# Patient Record
Sex: Female | Born: 1965 | Race: White | Hispanic: No | Marital: Single | Smoking: Former smoker
Health system: Southern US, Community
[De-identification: ages and names within clinical notes are randomized; demographics above are authoritative.]

## PROBLEM LIST (undated history)

## (undated) DIAGNOSIS — D241 Benign neoplasm of right breast: Secondary | ICD-10-CM

## (undated) DIAGNOSIS — J189 Pneumonia, unspecified organism: Secondary | ICD-10-CM

## (undated) DIAGNOSIS — R51 Headache: Secondary | ICD-10-CM

## (undated) DIAGNOSIS — R519 Headache, unspecified: Secondary | ICD-10-CM

## (undated) DIAGNOSIS — G8929 Other chronic pain: Secondary | ICD-10-CM

## (undated) DIAGNOSIS — N3941 Urge incontinence: Secondary | ICD-10-CM

## (undated) DIAGNOSIS — R32 Unspecified urinary incontinence: Secondary | ICD-10-CM

## (undated) DIAGNOSIS — Z9889 Other specified postprocedural states: Secondary | ICD-10-CM

## (undated) DIAGNOSIS — F909 Attention-deficit hyperactivity disorder, unspecified type: Secondary | ICD-10-CM

## (undated) DIAGNOSIS — K602 Anal fissure, unspecified: Secondary | ICD-10-CM

## (undated) DIAGNOSIS — S2239XA Fracture of one rib, unspecified side, initial encounter for closed fracture: Secondary | ICD-10-CM

## (undated) DIAGNOSIS — I10 Essential (primary) hypertension: Secondary | ICD-10-CM

## (undated) DIAGNOSIS — J329 Chronic sinusitis, unspecified: Secondary | ICD-10-CM

## (undated) DIAGNOSIS — J45909 Unspecified asthma, uncomplicated: Secondary | ICD-10-CM

## (undated) DIAGNOSIS — R112 Nausea with vomiting, unspecified: Secondary | ICD-10-CM

## (undated) DIAGNOSIS — C4490 Unspecified malignant neoplasm of skin, unspecified: Secondary | ICD-10-CM

## (undated) DIAGNOSIS — R072 Precordial pain: Secondary | ICD-10-CM

## (undated) DIAGNOSIS — S139XXA Sprain of joints and ligaments of unspecified parts of neck, initial encounter: Secondary | ICD-10-CM

## (undated) DIAGNOSIS — J4540 Moderate persistent asthma, uncomplicated: Secondary | ICD-10-CM

## (undated) DIAGNOSIS — A159 Respiratory tuberculosis unspecified: Secondary | ICD-10-CM

## (undated) DIAGNOSIS — J8283 Eosinophilic asthma: Secondary | ICD-10-CM

## (undated) DIAGNOSIS — R569 Unspecified convulsions: Secondary | ICD-10-CM

## (undated) DIAGNOSIS — F419 Anxiety disorder, unspecified: Secondary | ICD-10-CM

## (undated) DIAGNOSIS — I2699 Other pulmonary embolism without acute cor pulmonale: Secondary | ICD-10-CM

## (undated) HISTORY — DX: Unspecified urinary incontinence: R32

## (undated) HISTORY — PX: OTHER SURGICAL HISTORY: SHX169

## (undated) HISTORY — DX: Unspecified asthma, uncomplicated: J45.909

## (undated) HISTORY — DX: Anal fissure, unspecified: K60.2

## (undated) HISTORY — DX: Essential (primary) hypertension: I10

## (undated) HISTORY — DX: Benign neoplasm of right breast: D24.1

## (undated) HISTORY — DX: Unspecified convulsions: R56.9

## (undated) HISTORY — DX: Unspecified malignant neoplasm of skin, unspecified: C44.90

## (undated) HISTORY — DX: Eosinophilic asthma: J82.83

## (undated) HISTORY — DX: Urge incontinence: N39.41

## (undated) HISTORY — DX: Fracture of one rib, unspecified side, initial encounter for closed fracture: S22.39XA

## (undated) HISTORY — DX: Other chronic pain: G89.29

## (undated) HISTORY — DX: Moderate persistent asthma, uncomplicated: J45.40

## (undated) HISTORY — DX: Pneumonia, unspecified organism: J18.9

## (undated) HISTORY — DX: Other pulmonary embolism without acute cor pulmonale: I26.99

## (undated) HISTORY — DX: Precordial pain: R07.2

## (undated) HISTORY — DX: Anxiety disorder, unspecified: F41.9

## (undated) HISTORY — DX: Headache, unspecified: R51.9

## (undated) HISTORY — DX: Attention-deficit hyperactivity disorder, unspecified type: F90.9

## (undated) HISTORY — DX: Headache: R51

## (undated) HISTORY — DX: Sprain of joints and ligaments of unspecified parts of neck, initial encounter: S13.9XXA

## (undated) HISTORY — DX: Respiratory tuberculosis unspecified: A15.9

## (undated) HISTORY — PX: BREAST LUMPECTOMY: SHX2

---

## 1989-01-30 DIAGNOSIS — I2699 Other pulmonary embolism without acute cor pulmonale: Secondary | ICD-10-CM

## 1989-01-30 HISTORY — DX: Other pulmonary embolism without acute cor pulmonale: I26.99

## 2000-01-31 HISTORY — PX: TEMPOROMANDIBULAR JOINT ARTHROPLASTY: SUR76

## 2006-01-30 HISTORY — PX: BASAL CELL CARCINOMA EXCISION: SHX1214

## 2007-01-31 HISTORY — PX: CLAVICLE SURGERY: SHX598

## 2009-12-16 ENCOUNTER — Emergency Department (HOSPITAL_COMMUNITY)
Admission: EM | Admit: 2009-12-16 | Discharge: 2009-12-16 | Payer: Self-pay | Source: Home / Self Care | Admitting: Emergency Medicine

## 2010-04-12 LAB — URINALYSIS, ROUTINE W REFLEX MICROSCOPIC
Glucose, UA: NEGATIVE mg/dL
Specific Gravity, Urine: 1.015 (ref 1.005–1.030)

## 2010-04-12 LAB — POCT I-STAT, CHEM 8
Calcium, Ion: 1.13 mmol/L (ref 1.12–1.32)
Creatinine, Ser: 0.8 mg/dL (ref 0.4–1.2)
Hemoglobin: 15.3 g/dL — ABNORMAL HIGH (ref 12.0–15.0)
Sodium: 138 mEq/L (ref 135–145)
TCO2: 26 mmol/L (ref 0–100)

## 2010-04-12 LAB — URINE MICROSCOPIC-ADD ON

## 2010-04-12 LAB — POCT PREGNANCY, URINE: Preg Test, Ur: NEGATIVE

## 2010-10-07 ENCOUNTER — Other Ambulatory Visit: Payer: Self-pay | Admitting: Obstetrics and Gynecology

## 2010-10-07 DIAGNOSIS — N63 Unspecified lump in unspecified breast: Secondary | ICD-10-CM

## 2010-10-11 ENCOUNTER — Other Ambulatory Visit: Payer: Self-pay

## 2010-10-20 ENCOUNTER — Ambulatory Visit
Admission: RE | Admit: 2010-10-20 | Discharge: 2010-10-20 | Disposition: A | Discharge status: Home / Self Care | Payer: PRIVATE HEALTH INSURANCE | Source: Ambulatory Visit | Attending: Obstetrics and Gynecology | Admitting: Obstetrics and Gynecology

## 2010-10-20 DIAGNOSIS — N63 Unspecified lump in unspecified breast: Secondary | ICD-10-CM

## 2010-10-31 ENCOUNTER — Ambulatory Visit (INDEPENDENT_AMBULATORY_CARE_PROVIDER_SITE_OTHER): Payer: PRIVATE HEALTH INSURANCE | Admitting: General Surgery

## 2010-11-01 ENCOUNTER — Encounter (INDEPENDENT_AMBULATORY_CARE_PROVIDER_SITE_OTHER): Payer: Self-pay | Admitting: General Surgery

## 2010-11-01 ENCOUNTER — Ambulatory Visit (INDEPENDENT_AMBULATORY_CARE_PROVIDER_SITE_OTHER): Payer: PRIVATE HEALTH INSURANCE | Admitting: General Surgery

## 2010-11-01 VITALS — BP 116/82 | HR 60 | Temp 98.0°F | Resp 18 | Ht 63.5 in | Wt 135.4 lb

## 2010-11-01 DIAGNOSIS — N63 Unspecified lump in unspecified breast: Secondary | ICD-10-CM

## 2010-11-01 NOTE — Progress Notes (Signed)
Chief Complaint  Patient presents with  . Other    eval of right breast mass    HPI Tammie Sanders is a 45 y.o. female.   HPI This is a 45 year old female who presents with about a two-year history of a right breast mass. This area has gotten bigger and more irregular over some time. It is not as well defined as before. She underwent a biopsy previously which showed that this was benign. She has undergone a recent ultrasound which correlates with this more irregular larger mass in the results of that are below. She has some increased tenderness in his breast mass also. She denies any other breast complaints including other masses, tenderness, or nipple discharge. She has no prior breast history prior to this. She has no family history of any breast or ovarian cancer either.  Past Medical History  Diagnosis Date  . Seizures     Past Surgical History  Procedure Date  . Clavicle surgery 2009    plate in right clavicle   . Cesarean section 1996, [id number]  . Temporomandibular joint arthroplasty 2002    left     History reviewed. No pertinent family history.  Social History History  Substance Use Topics  . Smoking status: Former Games developer  . Smokeless tobacco: Never Used  . Alcohol Use: 0.0 oz/week    1-2 drink(s) per week    No Known Allergies  Current Outpatient Prescriptions  Medication Sig Dispense Refill  . fish oil-omega-3 fatty acids 1000 MG capsule Take 2 g by mouth daily.        Marland Kitchen FLUoxetine (PROZAC) 20 MG capsule       . Multiple Vitamin (MULTIVITAMIN PO) Take by mouth.        Marland Kitchen VITAMIN E PO Take by mouth.          Review of Systems Review of Systems  Constitutional: Negative.   HENT: Negative.   Eyes: Negative.   Respiratory: Negative.   Cardiovascular: Negative.   Gastrointestinal: Negative.   Genitourinary: Negative.   Musculoskeletal: Negative.   Neurological: Negative.   Hematological: Negative.   Psychiatric/Behavioral: Negative.     Blood pressure  116/82, pulse 60, temperature 98 F (36.7 C), resp. rate 18, height 5' 3.5" (1.613 m), weight 135 lb 6 oz (61.406 kg), last menstrual period 10/14/2010.  Physical Exam Physical Exam  Constitutional: She appears well-developed and well-nourished.  Neck: Neck supple.  Cardiovascular: Normal rate, regular rhythm and normal heart sounds.   Pulmonary/Chest: Effort normal and breath sounds normal. She has no wheezes. She has no rales. Right breast exhibits mass. Right breast exhibits no inverted nipple, no nipple discharge, no skin change and no tenderness. Left breast exhibits no inverted nipple, no mass, no nipple discharge, no skin change and no tenderness.    Lymphadenopathy:    She has no cervical adenopathy.    She has no axillary adenopathy.       Right: No supraclavicular adenopathy present.       Left: No supraclavicular adenopathy present.    Data Reviewed DIGITAL DIAGNOSTIC BILATERAL MAMMOGRAM WITH CAD AND RIGHT BREAST  ULTRASOUND:  Comparison: Prior mammograms dated 10/12/2008 and 04/11/2007.  Ultrasound report from exam dated 10/16/2008.  Findings: There is a fibroglandular pattern. A lobulated,  isodense mass with partially circumscribed and partially obscured  margins is seen in the middle third of the breast. This is  increased in size compared to the prior mammograms. A round low  density mass with circumscribed margins is  imaged in the upper-  outer quadrant of the right breast, posteriorly.  Mammographic images were processed with CAD.  On physical exam, I palpate a firm but mobile mass in the 1 o'clock  position, 3 cm the nipple. A soft, mobile mass is palpated in the  9 o'clock position, 4 cm from the nipple.  Ultrasound is performed, showing an oval hypoechoic mass with  lobulated margins in the 1 o'clock position, 3 cm the nipple  measuring 2.6 x 1.3 x 2.4 cm corresponding to the previously  biopsied mass (pathologically consistent with a fibroadenoma). By    report, prior measurements was 1.9 x 1.2 x 1.9 cm on October 16, 2008 and this was an increase from 2009 although no report or  measurements are given. A cyst is imaged in the upper-outer  quadrant of the right breast measuring 2.8 x 1.2 x 2.8 cm.  IMPRESSION: Enlarging fibroadenoma, right breast for which a  surgical consultation is scheduled with Dr. Dwain Sarna on October 2  at 11:00 a.m. Cyst, right breast. No mammographic evidence of  malignancy, left breast.   Assessment    Right breast mass    Plan    This mass has become more irregular it has enlarged recently. We discussed a right breast mass excisional biopsy to ensure that there is no tumor associated with this. We discussed the risks and benefits associated with the operation. My plan on doing this on a Friday per her request as soon as possible.       Tammie Sanders 11/01/2010, 12:44 PM

## 2010-11-15 ENCOUNTER — Encounter (HOSPITAL_BASED_OUTPATIENT_CLINIC_OR_DEPARTMENT_OTHER)
Admission: RE | Admit: 2010-11-15 | Discharge: 2010-11-15 | Disposition: A | Discharge status: Home / Self Care | Payer: PRIVATE HEALTH INSURANCE | Source: Ambulatory Visit | Attending: General Surgery | Admitting: General Surgery

## 2010-11-15 LAB — BASIC METABOLIC PANEL
CO2: 27 mEq/L (ref 19–32)
Calcium: 9.7 mg/dL (ref 8.4–10.5)
Creatinine, Ser: 0.64 mg/dL (ref 0.50–1.10)
GFR calc Af Amer: >90 mL/min (ref >90)
GFR calc non Af Amer: >90 mL/min (ref >90)
Sodium: 138 mEq/L (ref 135–145)

## 2010-11-15 LAB — DIFFERENTIAL
Basophils Relative: 1 % (ref 0–1)
Eosinophils Absolute: 0.1 10*3/uL (ref 0.0–0.7)
Eosinophils Relative: 2 % (ref 0–5)
Monocytes Relative: 10 % (ref 3–12)
Neutrophils Relative %: 58 % (ref 43–77)

## 2010-11-15 LAB — CBC
MCH: 31.5 pg (ref 26.0–34.0)
Platelets: 250 10*3/uL (ref 150–400)
RBC: 4.41 MIL/uL (ref 3.87–5.11)
RDW: 12.4 % (ref 11.5–15.5)

## 2010-11-18 ENCOUNTER — Other Ambulatory Visit (INDEPENDENT_AMBULATORY_CARE_PROVIDER_SITE_OTHER): Payer: Self-pay | Admitting: General Surgery

## 2010-11-18 ENCOUNTER — Ambulatory Visit (HOSPITAL_BASED_OUTPATIENT_CLINIC_OR_DEPARTMENT_OTHER)
Admission: RE | Admit: 2010-11-18 | Discharge: 2010-11-18 | Disposition: A | Discharge status: Home / Self Care | Payer: PRIVATE HEALTH INSURANCE | Source: Ambulatory Visit | Attending: General Surgery | Admitting: General Surgery

## 2010-11-18 DIAGNOSIS — N6009 Solitary cyst of unspecified breast: Secondary | ICD-10-CM | POA: Insufficient documentation

## 2010-11-18 DIAGNOSIS — N6019 Diffuse cystic mastopathy of unspecified breast: Secondary | ICD-10-CM

## 2010-11-18 DIAGNOSIS — Z01812 Encounter for preprocedural laboratory examination: Secondary | ICD-10-CM | POA: Insufficient documentation

## 2010-11-18 DIAGNOSIS — M26609 Unspecified temporomandibular joint disorder, unspecified side: Secondary | ICD-10-CM | POA: Insufficient documentation

## 2010-11-18 DIAGNOSIS — D249 Benign neoplasm of unspecified breast: Secondary | ICD-10-CM | POA: Insufficient documentation

## 2010-11-18 DIAGNOSIS — M129 Arthropathy, unspecified: Secondary | ICD-10-CM | POA: Insufficient documentation

## 2010-11-18 NOTE — Op Note (Signed)
  NAMEJAMEIKA, [name]               ACCOUNT NO.:  [id number]  MEDICAL RECORD NO.:  [id number]  LOCATION:                                 FACILITY:  PHYSICIAN:  Juanetta Gosling, MD     DATE OF BIRTH:  DATE OF PROCEDURE: DATE OF DISCHARGE:                              OPERATIVE REPORT   PREOPERATIVE DIAGNOSIS:  Right breast mass and right breast simple cyst.  POSTOPERATIVE DIAGNOSIS:  Right breast mass and right breast simple cyst.  PROCEDURE: 1. Right breast mass excisional biopsy. 2. Right breast cyst ultrasound-guided aspiration.  SURGEON:  Juanetta Gosling, MD  ASSISTANT:  None.  ANESTHESIA:  Local MAC.  SPECIMENS: 1. Right breast tissue marked short superior, long lateral, double     deep. 2. Right breast cyst fluid for cytology.  ESTIMATED BLOOD LOSS:  Minimal.  COMPLICATIONS:  None.  DRAINS:  None.  DISPOSITION:  The patient to recovery room in stable condition.  INDICATION:  This is a 45 year old female who has had a presence of a right breast mass for about 2 years and has undergone a biopsy before showing a fibroadenoma.  This area has gotten bigger and more irregular, and she presented for excisional biopsy due to that.  She also has a cyst that is vaguely palpable in the right upper outer quadrant on her ultrasound that we discussed aspirating as well.  PROCEDURE:  After informed consent was obtained, the patient was taken to the operating room.  She was administered 1 g of intravenous cefazolin.  Sequential compression devices were placed on lower extremities.  She was then placed under monitored anesthesia care.  Her right breast was then prepped and draped in a standard sterile surgical fashion.  Surgical time-out was then performed.  This mass was easily palpable she and I identified it prior to surgery. I then anesthetized the area with a combination of 1% Xylocaine and 0.25% Marcaine.  I then made a periareolar incision and carried down  to the mass.  The mass was felt like about a 3 cm fibroadenoma and this was excised in its entirety with electrocautery.  This was then marked as above.  I then placed a sponge in this.  I then used the ultrasound in the right upper outer quadrant about 5 cm from the nipple in the 9 o'clock position and identified a 3 x 2 cm simple cyst.  Using the ultrasound, I then advanced an 18-gauge needle into this cyst and withdrew about 3.5 mL of fluid that appeared straw-colored from this. The cyst was gone by ultrasound as well as by palpation upon completion. This was passed off the table as well.  I then returned to the incision. Hemostasis was then obtained.  I then closed this with 2-0 Vicryl, 3-0 Vicryl, 4-0 Monocryl.  Steri-Strips and sterile dressing were placed. She tolerated this well, was transferred to the recovery room in stable condition.     Juanetta Gosling, MD     MCW/MEDQ  D:  11/18/2010  T:  11/18/2010  Job:  [id number]  Electronically Signed by Emelia Loron MD on 11/18/2010 05:28:34 PM

## 2010-12-27 ENCOUNTER — Telehealth (INDEPENDENT_AMBULATORY_CARE_PROVIDER_SITE_OTHER): Payer: Self-pay

## 2010-12-27 NOTE — Telephone Encounter (Signed)
LMOM for pt to call our office to schedule f/u appt with Dr Dwain Sarna for her sx back in October.Hulda Humphrey

## 2011-11-01 ENCOUNTER — Encounter: Payer: Self-pay | Admitting: Internal Medicine

## 2011-11-07 ENCOUNTER — Other Ambulatory Visit: Payer: Self-pay | Admitting: Obstetrics and Gynecology

## 2011-11-07 DIAGNOSIS — R928 Other abnormal and inconclusive findings on diagnostic imaging of breast: Secondary | ICD-10-CM

## 2011-11-13 ENCOUNTER — Encounter: Payer: Self-pay | Admitting: Internal Medicine

## 2011-11-14 ENCOUNTER — Ambulatory Visit (INDEPENDENT_AMBULATORY_CARE_PROVIDER_SITE_OTHER): Payer: PRIVATE HEALTH INSURANCE | Admitting: Internal Medicine

## 2011-11-14 ENCOUNTER — Ambulatory Visit
Admission: RE | Admit: 2011-11-14 | Discharge: 2011-11-14 | Disposition: A | Discharge status: Home / Self Care | Payer: PRIVATE HEALTH INSURANCE | Source: Ambulatory Visit | Attending: Obstetrics and Gynecology | Admitting: Obstetrics and Gynecology

## 2011-11-14 ENCOUNTER — Other Ambulatory Visit (INDEPENDENT_AMBULATORY_CARE_PROVIDER_SITE_OTHER): Payer: PRIVATE HEALTH INSURANCE

## 2011-11-14 ENCOUNTER — Ambulatory Visit (HOSPITAL_COMMUNITY)
Admission: RE | Admit: 2011-11-14 | Discharge: 2011-11-14 | Disposition: A | Discharge status: Home / Self Care | Payer: PRIVATE HEALTH INSURANCE | Source: Ambulatory Visit | Attending: Internal Medicine | Admitting: Internal Medicine

## 2011-11-14 ENCOUNTER — Other Ambulatory Visit: Payer: Self-pay | Admitting: Obstetrics and Gynecology

## 2011-11-14 ENCOUNTER — Encounter: Payer: Self-pay | Admitting: Internal Medicine

## 2011-11-14 VITALS — BP 130/80 | HR 66 | Ht 64.0 in | Wt 138.2 lb

## 2011-11-14 DIAGNOSIS — R194 Change in bowel habit: Secondary | ICD-10-CM

## 2011-11-14 DIAGNOSIS — R928 Other abnormal and inconclusive findings on diagnostic imaging of breast: Secondary | ICD-10-CM

## 2011-11-14 DIAGNOSIS — R198 Other specified symptoms and signs involving the digestive system and abdomen: Secondary | ICD-10-CM

## 2011-11-14 DIAGNOSIS — R1011 Right upper quadrant pain: Secondary | ICD-10-CM

## 2011-11-14 DIAGNOSIS — K625 Hemorrhage of anus and rectum: Secondary | ICD-10-CM | POA: Insufficient documentation

## 2011-11-14 DIAGNOSIS — R1032 Left lower quadrant pain: Secondary | ICD-10-CM | POA: Insufficient documentation

## 2011-11-14 DIAGNOSIS — R112 Nausea with vomiting, unspecified: Secondary | ICD-10-CM | POA: Insufficient documentation

## 2011-11-14 LAB — CBC
HCT: 43.7 % (ref 36.0–46.0)
Hemoglobin: 14.4 g/dL (ref 12.0–15.0)
MCHC: 33 g/dL (ref 30.0–36.0)
MCV: 94.9 fl (ref 78.0–100.0)
Platelets: 234 K/uL (ref 150.0–400.0)
RBC: 4.61 Mil/uL (ref 3.87–5.11)
RDW: 12.5 % (ref 11.5–14.6)
WBC: 6.2 K/uL (ref 4.5–10.5)

## 2011-11-14 LAB — COMPREHENSIVE METABOLIC PANEL
ALT: 22 U/L (ref 0–35)
AST: 24 U/L (ref 0–37)
Albumin: 4.1 g/dL (ref 3.5–5.2)
CO2: 20 mEq/L (ref 19–32)
Calcium: 9.2 mg/dL (ref 8.4–10.5)
Chloride: 106 mEq/L (ref 96–112)
Creatinine, Ser: 0.6 mg/dL (ref 0.4–1.2)
GFR: 120.72 mL/min (ref >60.00)
Potassium: 3.9 mEq/L (ref 3.5–5.1)

## 2011-11-14 LAB — TSH: TSH: 2.25 u[IU]/mL (ref 0.35–5.50)

## 2011-11-14 LAB — SEDIMENTATION RATE: Sed Rate: 11 mm/hr (ref 0–22)

## 2011-11-14 LAB — IGA: IgA: 186 mg/dL (ref 68–378)

## 2011-11-14 MED ORDER — ALIGN PO CAPS: 1.0000 | ORAL_CAPSULE | Freq: Every day | ORAL | Status: DC

## 2011-11-14 MED ORDER — PEG-KCL-NACL-NASULF-NA ASC-C 100 G PO SOLR: 1.0000 | Freq: Once | ORAL | Status: DC

## 2011-11-14 NOTE — Progress Notes (Signed)
Patient ID: Tammie Sanders, female   DOB: 1966/01/24, 46 y.o.   MRN: [id number]  SUBJECTIVE: HPI Tammie Sanders is a 46 year old female with a past medical history of remote pulmonary embolism and seizure who is seen for evaluation of change in bowel pattern and rectal bleeding. The patient is alone today. She reports her issues started 8-10 months ago after which she felt like was "bad [place] food". She reports developing diarrhea 2-4 times daily over a 5-6 month period. Initially this was watery stools that were nonbloody, but towards the end of the 5 month period she developed bloody diarrhea. She initially sought treatment and was given a prescription for antibiotic which she feels the Cipro. She feels this made her a little better but her bowel habits is still not returned to normal. She continues to have loose, but not frankly water stools. She does still see bleeding with most if not all bowel movements. She denies tenesmus. Her stools are now 1-2 times a day and always loose. Prior to all of this starting she was regular with one formed bowel movement daily. She has not had lower abdominal pain. Her appetite has been good and she has not lost weight. She does report a history of hemorrhoids with thrombosis and bleeding, but does not feel this is an issue today.  She does report discrete episodes of upper and right upper quadrant pain abdominal pressure which is relieved by vomiting. This tends to follow the eating and has occurred 2 or 3 times over the last several months. She feels that she remotely had issues with her gallbladder, but has never had cholecystectomy. She does take acidophilus daily. No fevers or chills. No eye pain, rashes. She does note bilateral hand pain and left shoulder pain.  Review of Systems  As per history of present illness, otherwise negative   Past Medical History  Diagnosis Date  . Seizures   . Anal fissure   . Skin cancer     basal cell  . Pneumonia   . PE (pulmonary  embolism) 1991    After C-section    Current Outpatient Prescriptions  Medication Sig Dispense Refill  . fish oil-omega-3 fatty acids 1000 MG capsule Take 2 g by mouth daily.        Marland Kitchen FLUoxetine (PROZAC) 20 MG capsule       . Multiple Vitamin (MULTIVITAMIN PO) Take by mouth.        Marland Kitchen VITAMIN E PO Take by mouth.        . bifidobacterium infantis (ALIGN) capsule Take 1 capsule by mouth daily.  14 capsule  0  . peg 3350 powder (MOVIPREP) 100 G SOLR Take 1 kit (100 g total) by mouth once.  1 kit  0    No Known Allergies  Family History  Problem Relation Age of Onset  . Diabetes Father   . Irritable bowel syndrome Daughter   . Colon cancer Neg Hx     History  Substance Use Topics  . Smoking status: Former Smoker -- 5 years    Types: Cigarettes  . Smokeless tobacco: Never Used  . Alcohol Use: 0.0 oz/week    1-2 drink(s) per week     social    OBJECTIVE: BP 130/80  Pulse 66  Ht 5\' 4"  (1.626 m)  Wt 138 lb 3.2 oz (62.687 kg)  BMI 23.72 kg/m2  LMP 11/01/2011 Constitutional: Well-developed and well-nourished. No distress. HEENT: Normocephalic and atraumatic. Oropharynx is clear and moist. No oropharyngeal exudate. Conjunctivae  are normal. Pupils are equal round and reactive to light. No scleral icterus. Neck: Neck supple. Trachea midline. Cardiovascular: Normal rate, regular rhythm and intact distal pulses. No M/R/G Pulmonary/chest: Effort normal and breath sounds normal. No wheezing, rales or rhonchi. Abdominal: Soft, nontender, nondistended. Bowel sounds active throughout. There are no masses palpable. No hepatosplenomegaly. Extremities: no clubbing, cyanosis, or edema Lymphadenopathy: No cervical adenopathy noted. Neurological: Alert and oriented to person place and time. Skin: Skin is warm and dry. No rashes noted. Psychiatric: Normal mood and affect. Behavior is normal  ASSESSMENT AND PLAN: 46 year old female with a past medical history of remote pulmonary embolism and  seizure who is seen for evaluation of change in bowel pattern and rectal bleeding.  1.  Change in bowel habits/rectal bleeding -- I recommended colonoscopy for further evaluation. This seems to have gone too long to be explained by an infectious etiology, but post infectious irritable bowel is possible.  Inflammatory bowel disease is also in the differential and we have discussed this today. We also discussed colonoscopy and she is agreeable to proceed. I will give her a trial of align while she is waiting for colonoscopy. I will check labs today to include CBC, CMP, TSH, celiac panel and ESR  2.  RUQ pressure -- given her intermittent symptoms which are postprandial, I will perform a right upper quadrant ultrasound to evaluate her gallbladder and exclude stones. She does not have dyspeptic symptoms nor heartburn and therefore will not start PPI.

## 2011-11-14 NOTE — Patient Instructions (Addendum)
You have been scheduled for a colonoscopy with propofol. Please follow written instructions given to you at your visit today.  Please pick up your prep kit at the pharmacy within the next 1-3 days. If you use inhalers (even only as needed), please bring them with you on the day of your procedure.  We have sent the following medications to your pharmacy for you to pick up at your convenience: Movi prep   Your physician has requested that you go to the basement for the following lab work before leaving today: CBC. CMP, TSH. Celiac Panel, ESR  You have been scheduled an abdominal U/S at [facility] TODAY   At 4:15pm  Please arrive 15 minutes prior to your appointment

## 2011-11-15 ENCOUNTER — Other Ambulatory Visit: Payer: Self-pay | Admitting: *Deleted

## 2011-11-15 DIAGNOSIS — R109 Unspecified abdominal pain: Secondary | ICD-10-CM

## 2011-11-15 DIAGNOSIS — K828 Other specified diseases of gallbladder: Secondary | ICD-10-CM

## 2011-11-23 ENCOUNTER — Encounter: Payer: Self-pay | Admitting: Internal Medicine

## 2011-11-23 ENCOUNTER — Ambulatory Visit (AMBULATORY_SURGERY_CENTER): Payer: PRIVATE HEALTH INSURANCE | Admitting: Internal Medicine

## 2011-11-23 VITALS — BP 111/68 | HR 65 | Temp 98.6°F | Resp 11 | Ht 63.0 in | Wt 138.0 lb

## 2011-11-23 DIAGNOSIS — D126 Benign neoplasm of colon, unspecified: Secondary | ICD-10-CM

## 2011-11-23 DIAGNOSIS — K625 Hemorrhage of anus and rectum: Secondary | ICD-10-CM

## 2011-11-23 DIAGNOSIS — D1330 Benign neoplasm of unspecified part of small intestine: Secondary | ICD-10-CM

## 2011-11-23 DIAGNOSIS — R194 Change in bowel habit: Secondary | ICD-10-CM

## 2011-11-23 DIAGNOSIS — K635 Polyp of colon: Secondary | ICD-10-CM

## 2011-11-23 DIAGNOSIS — R198 Other specified symptoms and signs involving the digestive system and abdomen: Secondary | ICD-10-CM

## 2011-11-23 MED ORDER — HYDROCORTISONE ACETATE 25 MG RE SUPP: 25.0000 mg | Freq: Two times a day (BID) | RECTAL | Status: DC

## 2011-11-23 MED ORDER — SODIUM CHLORIDE 0.9 % IV SOLN: 500.0000 mL | INTRAVENOUS | Status: DC

## 2011-11-23 NOTE — Progress Notes (Signed)
Patient did not experience any of the following events: a burn prior to discharge; a fall within the facility; wrong site/side/patient/procedure/implant event; or a hospital transfer or hospital admission upon discharge from the facility. (G8907) Patient did not have preoperative order for IV antibiotic SSI prophylaxis. (G8918) Patient did not have preoperative order for IV antibiotic SSI prophylaxis. (G8918)  

## 2011-11-23 NOTE — Patient Instructions (Addendum)
Avoid NSAIDS(Ibuprofen,Motrin,Aleve, Naprosyn, etc)   YOU HAD AN ENDOSCOPIC PROCEDURE TODAY AT THE [facility]: Refer to the procedure report that was given to you for any specific questions about what was found during the examination.  If the procedure report does not answer your questions, please call your gastroenterologist to clarify.  If you requested that your care partner not be given the details of your procedure findings, then the procedure report has been included in a sealed envelope for you to review at your convenience later.  YOU SHOULD EXPECT: Some feelings of bloating in the abdomen. Passage of more gas than usual.  Walking can help get rid of the air that was put into your GI tract during the procedure and reduce the bloating. If you had a lower endoscopy (such as a colonoscopy or flexible sigmoidoscopy) you may notice spotting of blood in your stool or on the toilet paper. If you underwent a bowel prep for your procedure, then you may not have a normal bowel movement for a few days.  DIET: Your first meal following the procedure should be a light meal and then it is ok to progress to your normal diet.  A half-sandwich or bowl of soup is an example of a good first meal.  Heavy or fried foods are harder to digest and may make you feel nauseous or bloated.  Likewise meals heavy in dairy and vegetables can cause extra gas to form and this can also increase the bloating.  Drink plenty of fluids but you should avoid alcoholic beverages for 24 hours.  ACTIVITY: Your care partner should take you home directly after the procedure.  You should plan to take it easy, moving slowly for the rest of the day.  You can resume normal activity the day after the procedure however you should NOT DRIVE or use heavy machinery for 24 hours (because of the sedation medicines used during the test).    SYMPTOMS TO REPORT IMMEDIATELY: A gastroenterologist can be reached at any hour.  During normal  business hours, 8:30 AM to 5:00 PM Monday through Friday, call [phone].  After hours and on weekends, please call the GI answering service at [phone] who will take a message and have the physician on call contact you.   Following lower endoscopy (colonoscopy or flexible sigmoidoscopy):  Excessive amounts of blood in the stool  Significant tenderness or worsening of abdominal pains  Swelling of the abdomen that is new, acute  Fever of 100F or higher  FOLLOW UP: If any biopsies were taken you will be contacted by phone or by letter within the next 1-3 weeks.  Call your gastroenterologist if you have not heard about the biopsies in 3 weeks.  Our staff will call the home number listed on your records the next business day following your procedure to check on you and address any questions or concerns that you may have at that time regarding the information given to you following your procedure. This is a courtesy call and so if there is no answer at the home number and we have not heard from you through the emergency physician on call, we will assume that you have returned to your regular daily activities without incident.  SIGNATURES/CONFIDENTIALITY: You and/or your care partner have signed paperwork which will be entered into your electronic medical record.  These signatures attest to the fact that that the information above on your After Visit Summary has been reviewed and is understood.  Full  responsibility of the confidentiality of this discharge information lies with you and/or your care-partner.  

## 2011-11-23 NOTE — Op Note (Signed)
 [facility] [address] [place], [address]   COLONOSCOPY PROCEDURE REPORT  PATIENT: Tammie Sanders, Tammie Sanders  MR#: [id number] BIRTHDATE: 03-18-1965 , 46  yrs. old GENDER: Female ENDOSCOPIST: Beverley Fiedler, MD REFERRED BY: PROCEDURE DATE:  11/23/2011 PROCEDURE:   Colonoscopy with biopsy and Colonoscopy with cold biopsy polypectomy ASA CLASS:   Class II INDICATIONS:change in bowel habits and rectal bleeding. MEDICATIONS: MAC sedation, administered by CRNA and propofol (Diprivan) 350mg  IV  DESCRIPTION OF PROCEDURE:   After the risks benefits and alternatives of the procedure were thoroughly explained, informed consent was obtained.  A digital rectal exam revealed no abnormalities of the rectum.   The LB [id number]  endoscope was introduced through the anus and advanced to the terminal ileum which was intubated for a short distance. No adverse events experienced.   The quality of the prep was good, using MoviPrep The instrument was then slowly withdrawn as the colon was fully examined.   COLON FINDINGS: Mild possible ileitis with erythema and a few erosions was found in the terminal ileum.  This was a very short segment involving the view distal TI (3 cm in length max).  The more proximal ileum appeared normal.  Multiple biopsies of the area were performed using cold forceps.   A sessile polyp measuring 3 mm in size was found in the transverse colon.  A polypectomy was performed with cold forceps.  The resection was complete and the polyp tissue was completely retrieved.   Colon mucosa was otherwise normal.  Small internal hemorrhoids were found.  Retroflexed views revealed internal hemorrhoids. The time to cecum=3 minutes 52 seconds.  Withdrawal time=12 minutes 49 seconds.  The scope was withdrawn and the procedure completed.+ COMPLICATIONS: There were no complications.  ENDOSCOPIC IMPRESSION: 1.   Mild erosion and possible ileitis was found in the  terminal ileum;  biopsies of the area were performed using cold forceps 2.   Sessile polyp measuring 3 mm in size was found in the transverse colon; polypectomy was performed with cold forceps 3.   Small internal hemorrhoids  RECOMMENDATIONS: 1.  Await pathology results 2.  Avoid NSAIDS 3.  If the polyp removed today are proven to be adenomatous (pre-cancerous) polyps, you will need a repeat colonoscopy in 5 years.  Otherwise you should continue to follow colorectal cancer screening guidelines for "routine risk" patients with colonoscopy in 10 years.  You will receive a letter within 1-2 weeks with the results of your biopsy as well as final recommendations.  Please call my office if you have not received a letter after 3 weeks.   eSigned:  Beverley Fiedler, MD 11/23/2011 11:48 AM     cc: The Patient

## 2011-11-24 ENCOUNTER — Telehealth: Payer: Self-pay

## 2011-11-24 ENCOUNTER — Encounter (HOSPITAL_COMMUNITY)
Admission: RE | Admit: 2011-11-24 | Discharge: 2011-11-24 | Disposition: A | Discharge status: Home / Self Care | Payer: PRIVATE HEALTH INSURANCE | Source: Ambulatory Visit | Attending: Internal Medicine | Admitting: Internal Medicine

## 2011-11-24 DIAGNOSIS — K828 Other specified diseases of gallbladder: Secondary | ICD-10-CM | POA: Insufficient documentation

## 2011-11-24 DIAGNOSIS — R109 Unspecified abdominal pain: Secondary | ICD-10-CM | POA: Insufficient documentation

## 2011-11-24 MED ORDER — TECHNETIUM TC 99M MEBROFENIN IV KIT
5.5000 | PACK | Freq: Once | INTRAVENOUS | Status: AC | PRN
  Administered 2011-11-24: 6 via INTRAVENOUS

## 2011-11-24 NOTE — Telephone Encounter (Signed)
  Follow up Call-  Call back number 11/23/2011  Post procedure Call Back phone  # [phone]  Permission to leave phone message Yes     Patient questions:  Do you have a fever, pain , or abdominal swelling? no Pain Score  0 *  Have you tolerated food without any problems? yes  Have you been able to return to your normal activities? yes  Do you have any questions about your discharge instructions: Diet   no Medications  no Follow up visit  no  Do you have questions or concerns about your Care? no  Actions: * If pain score is 4 or above: No action needed, pain <4.

## 2011-11-29 ENCOUNTER — Encounter: Payer: Self-pay | Admitting: Internal Medicine

## 2011-11-30 ENCOUNTER — Other Ambulatory Visit: Payer: Self-pay | Admitting: Obstetrics and Gynecology

## 2011-11-30 ENCOUNTER — Other Ambulatory Visit: Payer: Self-pay | Admitting: *Deleted

## 2011-11-30 MED ORDER — SACCHAROMYCES BOULARDII 250 MG PO CAPS: 250.0000 mg | ORAL_CAPSULE | Freq: Two times a day (BID) | ORAL | Status: DC

## 2012-07-11 ENCOUNTER — Telehealth: Payer: Self-pay | Admitting: Emergency Medicine

## 2012-07-11 NOTE — Telephone Encounter (Signed)
Spoke with patient, patient has been scheduled to be seen by RB tomorrow 6/13 @415  Patient aware of our location and to bring all medications to appt.

## 2012-07-11 NOTE — Telephone Encounter (Signed)
ATC patient-- Per Dr. Delton Coombes patient needs to be scheduled for cough consult. No answer @ contact #, [email or web address]

## 2012-07-12 ENCOUNTER — Encounter: Payer: Self-pay | Admitting: Emergency Medicine

## 2012-07-12 ENCOUNTER — Ambulatory Visit (INDEPENDENT_AMBULATORY_CARE_PROVIDER_SITE_OTHER): Payer: PRIVATE HEALTH INSURANCE | Admitting: Emergency Medicine

## 2012-07-12 VITALS — BP 120/72 | HR 86 | Temp 98.9°F | Ht 64.0 in | Wt 138.8 lb

## 2012-07-12 DIAGNOSIS — R05 Cough: Secondary | ICD-10-CM | POA: Insufficient documentation

## 2012-07-12 DIAGNOSIS — R053 Chronic cough: Secondary | ICD-10-CM | POA: Insufficient documentation

## 2012-07-12 DIAGNOSIS — R059 Cough, unspecified: Secondary | ICD-10-CM

## 2012-07-12 MED ORDER — HYDROCOD POLST-CHLORPHEN POLST 10-8 MG/5ML PO LQCR: 5.0000 mL | Freq: Two times a day (BID) | ORAL | Status: DC

## 2012-07-12 MED ORDER — BENZONATATE 200 MG PO CAPS: 200.0000 mg | ORAL_CAPSULE | Freq: Three times a day (TID) | ORAL | Status: DC | PRN

## 2012-07-12 NOTE — Progress Notes (Signed)
Subjective:    Patient ID: Tammie Sanders, female    DOB: 1965-03-06, 47 y.o.   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  She was not a cougher until 12/'13 when she had an exposure to mold that caused allergies and started the cough. She was treated with prednisone, flonase spray. Using benadryl.  She may be experiencing some GERD sx.    Review of Systems  Constitutional: Negative for fever and unexpected weight change.  HENT: Negative for ear pain, nosebleeds, congestion, sore throat, rhinorrhea, sneezing, trouble swallowing, dental problem, postnasal drip and sinus pressure.   Eyes: Negative for redness and itching.  Respiratory: Positive for chest tightness and shortness of breath. Negative for cough and wheezing.   Cardiovascular: Positive for palpitations. Negative for leg swelling.  Gastrointestinal: Negative for nausea and vomiting.  Genitourinary: Negative for dysuria.  Musculoskeletal: Negative for joint swelling.  Skin: Negative for rash.  Neurological: Positive for dizziness, light-headedness and headaches.  Hematological: Does not bruise/bleed easily.  Psychiatric/Behavioral: Negative for dysphoric mood. The patient is not nervous/anxious.    Past Medical History  Diagnosis Date  . Anal fissure   . Skin cancer     basal cell  . Pneumonia   . PE (pulmonary embolism) 1991    After C-section  . Anxiety   . 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 Tuberculosis     tested positive to exposure; xray clear  . Chronic headaches      Family History  Problem Relation Age of Onset  . Diabetes Father   . Irritable bowel syndrome Daughter   . Colon cancer Neg Hx   . Esophageal cancer Neg Hx   . Rectal cancer Neg Hx   . Stomach cancer Neg Hx   . Asthma Mother   . Cancer Father     skin  . Cancer Maternal Grandfather     skin  . Rheum arthritis Mother      History    Social History  . Marital Status: Single    Spouse Name: N/A    Number of Children: 2  . Years of Education: N/A   Occupational History  . office manager    Social History Main Topics  . Smoking status: Former Smoker -- 1.00 packs/day for 5 years    Types: Cigarettes    Quit date: 01/31/1988  . Smokeless tobacco: Never Used  . Alcohol Use: 0.0 oz/week    1-2 drink(s) per week     Comment: social  . Drug Use: No  . Sexually Active: Not on file   Other Topics Concern  . Not on file   Social History Narrative  . No narrative on file     No Known Allergies   Outpatient Prescriptions Prior to Visit  Medication Sig Dispense Refill  . fish oil-omega-3 fatty acids 1000 MG capsule Take 2 g by mouth daily.        Marland Kitchen FLUoxetine (PROZAC) 20 MG capsule       . MELATONIN PO Take by mouth as needed.      . Multiple Vitamin (MULTIVITAMIN PO) Take by mouth.        Marland Kitchen VITAMIN E PO Take by mouth.        . bifidobacterium infantis (ALIGN) capsule Take 1 capsule by mouth daily.  14 capsule  0  . hydrocortisone (ANUSOL-HC) 25 MG  suppository Place 1 suppository (25 mg total) rectally every 12 (twelve) hours.  12 suppository  1  . saccharomyces boulardii (FLORASTOR) 250 MG capsule Take 1 capsule (250 mg total) by mouth 2 (two) times daily.  60 capsule  0   No facility-administered medications prior to visit.       Objective:   Physical Exam Filed Vitals:   07/12/12 1631  BP: 120/72  Pulse: 86  Temp: 98.9 F (37.2 C)  TempSrc: Oral  Height: 5\' 4"  (1.626 m)  Weight: 138 lb 12.8 oz (62.959 kg)  SpO2: 98%   Gen: Pleasant, well-nourished, in no distress,  normal affect  ENT: No lesions,  mouth clear,  oropharynx clear, no postnasal drip  Neck: No JVD, no TMG, no carotid bruits  Lungs: No use of accessory muscles, no dullness to percussion, clear without rales or rhonchi  Cardiovascular: RRR, heart sounds normal, no murmur or gallops, no peripheral edema  Musculoskeletal: No  deformities, no cyanosis or clubbing  Neuro: alert, non focal  Skin: Warm, no lesions or rashes      Assessment & Plan:  Chronic cough Suspect influence of GERD + allergic rhinitis - will treat both of the above agressively - hold off on PFT or GI referral for now; she has seen Dr Rhea Belton before - cyclical cough protocol - rov 6 weeks

## 2012-07-12 NOTE — Assessment & Plan Note (Addendum)
Suspect influence of GERD + allergic rhinitis - will treat both of the above agressively - hold off on PFT or GI referral for now; she has seen Dr Rhea Belton before - cyclical cough protocol - rov 6 weeks

## 2012-07-12 NOTE — Patient Instructions (Addendum)
Start omeprazole 20mg  twice a day for 2 weeks and then decrease to once a day Continue zyrtec daily Increase fluticasone nasal spray to 2 sprays each nostril twice a day Consider starting nasal saline washes daily Use tussionex and tessalon perles for cough suppression as directed  Try to do voice rest as per the Cyclical Cough protocol

## 2012-07-23 ENCOUNTER — Telehealth: Payer: Self-pay | Admitting: Emergency Medicine

## 2012-07-23 ENCOUNTER — Other Ambulatory Visit: Payer: Self-pay | Admitting: Emergency Medicine

## 2012-07-23 NOTE — Telephone Encounter (Signed)
Pt returned call. Tammie Sanders  

## 2012-07-23 NOTE — Telephone Encounter (Signed)
Done

## 2012-07-23 NOTE — Telephone Encounter (Signed)
RB called. He ahs not other recs. Pt is scheduled to come in and see RA in the am for an eval. Nothing further was needed

## 2012-07-23 NOTE — Telephone Encounter (Signed)
Spoke with pt and given Dr Kavin Leech recommendations.  PT has refill on Tussionex and will get this at pharmacy and update [place] on progress next week.

## 2012-07-23 NOTE — Telephone Encounter (Signed)
I agree with refill of the tussionex for now.  If her cough doesn't quiet down then we may decide to visualize her cords - either myself or by ENT

## 2012-07-23 NOTE — Telephone Encounter (Signed)
Per OV from 07/12/12  With RB:  Patient Instructions    Start omeprazole 20mg  twice a day for 2 weeks and then decrease to once a day  Continue zyrtec daily  Increase fluticasone nasal spray to 2 sprays each nostril twice a day  Consider starting nasal saline washes daily  Use tussionex and tessalon perles for cough suppression as directed  Try to do voice rest as per the Cyclical Cough protocol   ------  [id number]

## 2012-07-23 NOTE — Telephone Encounter (Signed)
Called, spoke with pt.  Reports cough was quieter and controllable while taking the tussinex.  She finished the tussionex and is taking the tessalon tid with no control on the cough.  States cough is dry, wheezy, and uncontrollable now.  Feels it is back to where it was when she can in on June 13.  She has also followed the below recs per RB.  She is requesting further recs on what to do from here -- would like to know if she should get a refill on tussionex.  RB, pls advise.  ** RB paged per protocol.

## 2012-07-23 NOTE — Telephone Encounter (Signed)
I spoke with pt. She stated her insurance only pays for the tussionex #140 after that she has to pay the rest out of pocket which is over $300. She can not afford this. She stated since Saturday she has had an uncontrollable cough since she has ran out of the cough syrup. The tessalon pearles does not help. She has like cough spasms. Pt is requetsing further recs. Please advise rb THANKS   No Known Allergies

## 2012-07-24 ENCOUNTER — Other Ambulatory Visit: Payer: PRIVATE HEALTH INSURANCE

## 2012-07-24 ENCOUNTER — Telehealth: Payer: Self-pay | Admitting: Pulmonary Disease

## 2012-07-24 ENCOUNTER — Encounter: Payer: Self-pay | Admitting: Pulmonary Disease

## 2012-07-24 ENCOUNTER — Ambulatory Visit (INDEPENDENT_AMBULATORY_CARE_PROVIDER_SITE_OTHER)
Admission: RE | Admit: 2012-07-24 | Discharge: 2012-07-24 | Disposition: A | Discharge status: Home / Self Care | Payer: PRIVATE HEALTH INSURANCE | Source: Ambulatory Visit | Attending: Pulmonary Disease | Admitting: Pulmonary Disease

## 2012-07-24 ENCOUNTER — Ambulatory Visit (INDEPENDENT_AMBULATORY_CARE_PROVIDER_SITE_OTHER): Payer: PRIVATE HEALTH INSURANCE | Admitting: Pulmonary Disease

## 2012-07-24 VITALS — BP 122/78 | HR 80 | Temp 98.4°F | Ht 64.0 in | Wt 140.2 lb

## 2012-07-24 DIAGNOSIS — R05 Cough: Secondary | ICD-10-CM

## 2012-07-24 DIAGNOSIS — R059 Cough, unspecified: Secondary | ICD-10-CM

## 2012-07-24 DIAGNOSIS — R053 Chronic cough: Secondary | ICD-10-CM

## 2012-07-24 MED ORDER — BECLOMETHASONE DIPROPIONATE 80 MCG/ACT IN AERS: 1.0000 | INHALATION_SPRAY | Freq: Two times a day (BID) | RESPIRATORY_TRACT | Status: DC

## 2012-07-24 MED ORDER — HYDROCOD POLST-CHLORPHEN POLST 10-8 MG/5ML PO LQCR: ORAL | Status: DC

## 2012-07-24 NOTE — Progress Notes (Signed)
  Subjective:    Patient ID: Tammie Sanders, female    DOB: 1965-03-28, 47 y.o.   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 She was not a cougher until 12/'13 when she had an exposure to mold that caused allergies and started the cough. She was treated with prednisone, flonase spray. Using benadryl. She may be experiencing some GERD sx Seen 07/16/12 by DR Delton Coombes >>Start omeprazole 20mg  twice a day for 2 weeks and then decrease to once a day  Continue zyrtec daily  Increase fluticasone nasal spray to 2 sprays each nostril twice a day  Consider starting nasal saline washes daily  Use tussionex and tessalon perles for cough suppression as directed  Try to do voice rest as per the Cyclical Cough protocol   07/24/2012 Tussionex gave some relief - but cough persists Pt c/o dry cough, chest tx at times. Cough spasms at times. cough wakes her up in the middle of night, hoarseness. she has had an uncontrollable cough since the weekend.  No seasonal // diurnal variation Occ wheeze with paroxysms No reflux or obvious PND    Review of Systems neg for any significant sore throat, dysphagia, itching, sneezing, nasal congestion or excess/ purulent secretions, fever, chills, sweats, unintended wt loss, pleuritic or exertional cp, hempoptysis, orthopnea pnd or change in chronic leg swelling. Also denies presyncope, palpitations, heartburn, abdominal pain, nausea, vomiting, diarrhea or change in bowel or urinary habits, dysuria,hematuria, rash, arthralgias, visual complaints, headache, numbness weakness or ataxia.     Objective:   Physical Exam  Gen. Pleasant, well-nourished, in no distress ENT - no lesions, no post nasal drip Neck: No JVD, no thyromegaly, no carotid bruits Lungs: no use of accessory muscles, no dullness to percussion, clear without rales or rhonchi  Cardiovascular: Rhythm regular, heart sounds  normal, no murmurs or gallops, no  peripheral edema Musculoskeletal: No deformities, no cyanosis or clubbing         Assessment & Plan:

## 2012-07-24 NOTE — Addendum Note (Signed)
Addended by: Tommie Sams on: 07/24/2012 02:24 PM   Modules accepted: Orders

## 2012-07-24 NOTE — Telephone Encounter (Signed)
Spoke with [facility]; they have Rx and its been filled. Waiting for patient to pick up. I spoke with patient-she is aware and states she also got a call from [facility] telling her the Rx is ready for pick up. Nothing more needed. Will sign off on message.

## 2012-07-24 NOTE — Assessment & Plan Note (Addendum)
Favor combination of upper airway cough + GERD, doubt reactive airways, but note h/o wheezing CXR & blood work for allergies today Trial of Qvar 80 1 puff twice daily  Stay on sudafed twice daily For cough suppression - Refill on Tussionex 5ml at bedtime x 120 ml (do not take benadryl when you take this, already has CPM) DELSYM 2 tsp twice daily- daytime Stay on tessalon 200 thrice daily, cough drops ok

## 2012-07-24 NOTE — Patient Instructions (Addendum)
CXR & blood work for allergies today Trial of Qvar 80 1 puff twice daily  Stay on sudafed twice daily Refill on Tussionex 5ml at bedtime x 120 ml (do not take benadryl when you take this) DELSYM 2 tsp twice daily- daytime Stay on tessalon 200 thrice daily, cough drops ok

## 2012-07-25 LAB — ALLERGY FULL PROFILE
Allergen, D pternoyssinus,d7: <0.1 kU/L
Alternaria Alternata: <0.1 kU/L
Aspergillus fumigatus, m3: <0.1 kU/L
Bahia Grass: <0.1 kU/L
Cat Dander: <0.1 kU/L
D. farinae: <0.1 kU/L
Elm IgE: <0.1 kU/L
G009 Red Top: <0.1 kU/L
House Dust Hollister: <0.1 kU/L
Lamb's Quarters: <0.1 kU/L
Plantain: <0.1 kU/L
Sycamore Tree: <0.1 kU/L

## 2012-07-30 ENCOUNTER — Telehealth: Payer: Self-pay | Admitting: Pulmonary Disease

## 2012-07-30 NOTE — Telephone Encounter (Signed)
Notes Recorded by Oretha Milch, MD on 07/28/2012 at 1:34 AM Allergy profile neg   I spoke with patient about results and she verbalized understanding and had no questions

## 2012-08-15 ENCOUNTER — Ambulatory Visit (INDEPENDENT_AMBULATORY_CARE_PROVIDER_SITE_OTHER): Payer: PRIVATE HEALTH INSURANCE | Admitting: Emergency Medicine

## 2012-08-15 ENCOUNTER — Encounter: Payer: Self-pay | Admitting: Emergency Medicine

## 2012-08-15 VITALS — BP 116/66 | HR 75 | Temp 99.7°F | Ht 64.0 in | Wt 137.4 lb

## 2012-08-15 DIAGNOSIS — R059 Cough, unspecified: Secondary | ICD-10-CM

## 2012-08-15 DIAGNOSIS — R05 Cough: Secondary | ICD-10-CM

## 2012-08-15 DIAGNOSIS — R053 Chronic cough: Secondary | ICD-10-CM

## 2012-08-15 NOTE — Progress Notes (Signed)
  Subjective:    Patient ID: Tammie Sanders, female    DOB: 05-25-1965, 47 y.o.   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 She was not a cougher until 12/'13 when she had an exposure to mold that caused allergies and started the cough. She was treated with prednisone, flonase spray. Using benadryl. She may be experiencing some GERD sx Seen 07/16/12 by DR Delton Coombes >>Start omeprazole 20mg  twice a day for 2 weeks and then decrease to once a day  Continue zyrtec daily  Increase fluticasone nasal spray to 2 sprays each nostril twice a day  Consider starting nasal saline washes daily  Use tussionex and tessalon perles for cough suppression as directed  Try to do voice rest as per the Cyclical Cough protocol   07/24/12 --  Tussionex gave some relief - but cough persists Pt c/o dry cough, chest tx at times. Cough spasms at times. cough wakes her up in the middle of night, hoarseness. she has had an uncontrollable cough since the weekend.  No seasonal // diurnal variation Occ wheeze with paroxysms No reflux or obvious PND  ROV 08/15/12 -- f/u for cough. Saw Dr Vassie Loll as above. She was started on QVAR, had allergy serologies that were negative. She stopped the allergy meds above. Continued omeprazole qhs. Her cough is better. Her voice is more raspy.     Review of Systems     Objective:   Physical Exam Filed Vitals:   08/15/12 0912 08/15/12 0914  BP:  116/66  Pulse:  75  Temp: 99.7 F (37.6 C)   TempSrc: Oral   Height: 5\' 4"  (1.626 m)   Weight: 137 lb 6.4 oz (62.324 kg)   SpO2:  98%   Gen. Pleasant, well-nourished, in no distress ENT - no lesions, no post nasal drip Neck: No JVD, no thyromegaly, no carotid bruits Lungs: no use of accessory muscles, no dullness to percussion, clear without rales or rhonchi  Cardiovascular: Rhythm regular, heart sounds  normal, no murmurs or gallops, no peripheral edema Musculoskeletal: No  deformities, no cyanosis or clubbing      Assessment & Plan:  Chronic cough - hold off on restarting any allergy meds for now - continue omeprazole - stop qvar for now to get good spirometry - methacholine challenge  - rov after to review.

## 2012-08-15 NOTE — Patient Instructions (Addendum)
Please stop QVAR for now Continue your omeprazole each evening We will perform breathing testing and then follow up next available to review.

## 2012-08-15 NOTE — Assessment & Plan Note (Signed)
-   hold off on restarting any allergy meds for now - continue omeprazole - stop qvar for now to get good spirometry - methacholine challenge  - rov after to review.

## 2012-09-19 ENCOUNTER — Ambulatory Visit: Payer: PRIVATE HEALTH INSURANCE | Admitting: Emergency Medicine

## 2013-06-16 ENCOUNTER — Other Ambulatory Visit: Payer: Self-pay | Admitting: Obstetrics and Gynecology

## 2013-07-21 ENCOUNTER — Encounter (HOSPITAL_COMMUNITY): Payer: Self-pay | Admitting: *Deleted

## 2013-07-21 NOTE — H&P (Addendum)
48 yo G2p2 with menorrhagia and cervical dysplasia presents for surgical mngt  PMHx:  Asthma, migraines, anxiety, HSV PSHx:  c-section x 2, BTL, right clavicle surgery, TMJ, lumpectomy All:  None Meds:  Fluoxetine, Xyzal, singulair, Q vair Shx:  Negative tobacco  AF, VSS Gen - NAD Abd - soft, NT CV - RRR Lungs - clear PV - uterus enlarged, mobile, NT  [place] - 12 mm polypoid mass in endometrium, fibroids, normal ovaries cOLPO - CIN 1  A/P:  Dysplasia, Menorrhagia Leep, hysteroscopy, D&C, removal of endometrial mass, endometrial ablation R/b/a discussed, questions answered, informed consent

## 2013-07-22 ENCOUNTER — Encounter (HOSPITAL_COMMUNITY): Payer: Self-pay | Admitting: Pharmacist

## 2013-07-24 MED ORDER — DEXTROSE 5 % IV SOLN
2.0000 g | INTRAVENOUS | Status: AC
  Administered 2013-07-25: 2 g via INTRAVENOUS
  Filled 2013-07-24: qty 2

## 2013-07-25 ENCOUNTER — Encounter (HOSPITAL_COMMUNITY): Payer: 59 | Admitting: Anesthesiology

## 2013-07-25 ENCOUNTER — Ambulatory Visit (HOSPITAL_COMMUNITY): Payer: 59 | Admitting: Anesthesiology

## 2013-07-25 ENCOUNTER — Encounter (HOSPITAL_COMMUNITY)
Admission: RE | Disposition: A | Discharge status: Home / Self Care | Payer: Self-pay | Source: Ambulatory Visit | Attending: Obstetrics and Gynecology

## 2013-07-25 ENCOUNTER — Ambulatory Visit (HOSPITAL_COMMUNITY)
Admission: RE | Admit: 2013-07-25 | Discharge: 2013-07-25 | Disposition: A | Discharge status: Home / Self Care | Payer: 59 | Source: Ambulatory Visit | Attending: Obstetrics and Gynecology | Admitting: Obstetrics and Gynecology

## 2013-07-25 ENCOUNTER — Encounter (HOSPITAL_COMMUNITY): Payer: Self-pay | Admitting: Anesthesiology

## 2013-07-25 DIAGNOSIS — N840 Polyp of corpus uteri: Secondary | ICD-10-CM | POA: Insufficient documentation

## 2013-07-25 DIAGNOSIS — J45909 Unspecified asthma, uncomplicated: Secondary | ICD-10-CM | POA: Insufficient documentation

## 2013-07-25 DIAGNOSIS — N870 Mild cervical dysplasia: Secondary | ICD-10-CM | POA: Insufficient documentation

## 2013-07-25 DIAGNOSIS — Z87891 Personal history of nicotine dependence: Secondary | ICD-10-CM | POA: Insufficient documentation

## 2013-07-25 DIAGNOSIS — F411 Generalized anxiety disorder: Secondary | ICD-10-CM | POA: Insufficient documentation

## 2013-07-25 DIAGNOSIS — N920 Excessive and frequent menstruation with regular cycle: Secondary | ICD-10-CM | POA: Insufficient documentation

## 2013-07-25 HISTORY — DX: Other specified postprocedural states: R11.2

## 2013-07-25 HISTORY — DX: Other specified postprocedural states: Z98.890

## 2013-07-25 HISTORY — PX: DILITATION & CURRETTAGE/HYSTROSCOPY WITH NOVASURE ABLATION: SHX5568

## 2013-07-25 HISTORY — PX: LEEP: SHX91

## 2013-07-25 LAB — CBC
HCT: 40.1 % (ref 36.0–46.0)
HEMOGLOBIN: 13.4 g/dL (ref 12.0–15.0)
MCH: 31.8 pg (ref 26.0–34.0)
MCHC: 33.4 g/dL (ref 30.0–36.0)
MCV: 95 fL (ref 78.0–100.0)
Platelets: 221 10*3/uL (ref 150–400)
RBC: 4.22 MIL/uL (ref 3.87–5.11)
RDW: 13 % (ref 11.5–15.5)
WBC: 8.4 10*3/uL (ref 4.0–10.5)

## 2013-07-25 SURGERY — DILATATION & CURETTAGE/HYSTEROSCOPY WITH NOVASURE ABLATION
Anesthesia: General | Site: Uterus

## 2013-07-25 MED ORDER — LIDOCAINE IN DEXTROSE 5-7.5 % IV SOLN: INTRAVENOUS | Status: DC | PRN

## 2013-07-25 MED ORDER — LIDOCAINE HCL (CARDIAC) 20 MG/ML IV SOLN
INTRAVENOUS | Status: DC | PRN
  Administered 2013-07-25: 30 mg via INTRAVENOUS

## 2013-07-25 MED ORDER — DEXAMETHASONE SODIUM PHOSPHATE 10 MG/ML IJ SOLN
INTRAMUSCULAR | Status: AC
  Filled 2013-07-25: qty 1

## 2013-07-25 MED ORDER — HYDROMORPHONE HCL 2 MG PO TABS
ORAL_TABLET | ORAL | Status: AC
  Filled 2013-07-25: qty 1

## 2013-07-25 MED ORDER — ONDANSETRON HCL 4 MG/2ML IJ SOLN
INTRAMUSCULAR | Status: AC
  Filled 2013-07-25: qty 2

## 2013-07-25 MED ORDER — FENTANYL CITRATE 0.05 MG/ML IJ SOLN
INTRAMUSCULAR | Status: AC
  Filled 2013-07-25: qty 2

## 2013-07-25 MED ORDER — KETOROLAC TROMETHAMINE 30 MG/ML IJ SOLN
INTRAMUSCULAR | Status: AC
  Filled 2013-07-25: qty 1

## 2013-07-25 MED ORDER — EPHEDRINE SULFATE 50 MG/ML IJ SOLN
INTRAMUSCULAR | Status: DC | PRN
  Administered 2013-07-25: 5 mg via INTRAVENOUS
  Administered 2013-07-25: 10 mg via INTRAVENOUS

## 2013-07-25 MED ORDER — HYDROMORPHONE HCL 2 MG PO TABS
2.0000 mg | ORAL_TABLET | Freq: Once | ORAL | Status: AC
  Administered 2013-07-25: 2 mg via ORAL

## 2013-07-25 MED ORDER — LACTATED RINGERS IV SOLN
INTRAVENOUS | Status: DC
  Administered 2013-07-25 (×2): via INTRAVENOUS

## 2013-07-25 MED ORDER — PROPOFOL 10 MG/ML IV EMUL
INTRAVENOUS | Status: AC
  Filled 2013-07-25: qty 20

## 2013-07-25 MED ORDER — EPHEDRINE 5 MG/ML INJ
INTRAVENOUS | Status: AC
  Filled 2013-07-25: qty 10

## 2013-07-25 MED ORDER — MIDAZOLAM HCL 2 MG/2ML IJ SOLN
INTRAMUSCULAR | Status: DC | PRN
  Administered 2013-07-25: 1 mg via INTRAVENOUS

## 2013-07-25 MED ORDER — LIDOCAINE HCL 1 % IJ SOLN
INTRAMUSCULAR | Status: DC | PRN
  Administered 2013-07-25: 10 mL

## 2013-07-25 MED ORDER — BUPIVACAINE HCL (PF) 0.5 % IJ SOLN
INTRAMUSCULAR | Status: AC
  Filled 2013-07-25: qty 30

## 2013-07-25 MED ORDER — LACTATED RINGERS IV SOLN
INTRAVENOUS | Status: DC | PRN
  Administered 2013-07-25: 3000 mL via INTRAVENOUS

## 2013-07-25 MED ORDER — ACETAMINOPHEN 160 MG/5ML PO SOLN
ORAL | Status: AC
  Administered 2013-07-25: 1000 mg via ORAL
  Filled 2013-07-25: qty 40.6

## 2013-07-25 MED ORDER — ONDANSETRON HCL 4 MG/2ML IJ SOLN
INTRAMUSCULAR | Status: DC | PRN
  Administered 2013-07-25: 4 mg via INTRAVENOUS

## 2013-07-25 MED ORDER — MIDAZOLAM HCL 2 MG/2ML IJ SOLN
INTRAMUSCULAR | Status: AC
  Filled 2013-07-25: qty 2

## 2013-07-25 MED ORDER — DEXAMETHASONE SODIUM PHOSPHATE 10 MG/ML IJ SOLN
INTRAMUSCULAR | Status: DC | PRN
  Administered 2013-07-25: 10 mg via INTRAVENOUS

## 2013-07-25 MED ORDER — SCOPOLAMINE 1 MG/3DAYS TD PT72
1.0000 | MEDICATED_PATCH | Freq: Once | TRANSDERMAL | Status: DC
  Administered 2013-07-25: 1.5 mg via TRANSDERMAL

## 2013-07-25 MED ORDER — KETOROLAC TROMETHAMINE 30 MG/ML IJ SOLN: INTRAMUSCULAR | Status: DC | PRN

## 2013-07-25 MED ORDER — IODINE STRONG (LUGOLS) 5 % PO SOLN
ORAL | Status: AC
  Filled 2013-07-25: qty 1

## 2013-07-25 MED ORDER — FERRIC SUBSULFATE 259 MG/GM EX SOLN
CUTANEOUS | Status: AC
  Filled 2013-07-25: qty 8

## 2013-07-25 MED ORDER — HYDROMORPHONE HCL 2 MG PO TABS: 2.0000 mg | ORAL_TABLET | ORAL | Status: DC | PRN

## 2013-07-25 MED ORDER — PROPOFOL 10 MG/ML IV BOLUS
INTRAVENOUS | Status: DC | PRN
  Administered 2013-07-25: 180 mg via INTRAVENOUS

## 2013-07-25 MED ORDER — ACETAMINOPHEN 160 MG/5ML PO SOLN
1000.0000 mg | Freq: Four times a day (QID) | ORAL | Status: DC | PRN
  Administered 2013-07-25: 1000 mg via ORAL

## 2013-07-25 MED ORDER — KETOROLAC TROMETHAMINE 30 MG/ML IJ SOLN
INTRAMUSCULAR | Status: DC | PRN
  Administered 2013-07-25: 30 mg via INTRAVENOUS

## 2013-07-25 MED ORDER — FENTANYL CITRATE 0.05 MG/ML IJ SOLN
25.0000 ug | INTRAMUSCULAR | Status: DC | PRN
  Administered 2013-07-25 (×2): 50 ug via INTRAVENOUS

## 2013-07-25 MED ORDER — FENTANYL CITRATE 0.05 MG/ML IJ SOLN
INTRAMUSCULAR | Status: DC | PRN
  Administered 2013-07-25 (×4): 50 ug via INTRAVENOUS

## 2013-07-25 MED ORDER — FENTANYL CITRATE 0.05 MG/ML IJ SOLN
INTRAMUSCULAR | Status: AC
  Administered 2013-07-25: 50 ug via INTRAVENOUS
  Filled 2013-07-25: qty 2

## 2013-07-25 MED ORDER — LIDOCAINE HCL (CARDIAC) 20 MG/ML IV SOLN
INTRAVENOUS | Status: AC
  Filled 2013-07-25: qty 5

## 2013-07-25 MED ORDER — ACETIC ACID 5 % SOLN
Status: AC
  Filled 2013-07-25: qty 500

## 2013-07-25 MED ORDER — LIDOCAINE HCL 1 % IJ SOLN
INTRAMUSCULAR | Status: AC
  Filled 2013-07-25: qty 20

## 2013-07-25 SURGICAL SUPPLY — 37 items
ABLATOR ENDOMETRIAL BIPOLAR (ABLATOR) ×3 IMPLANT
APPLICATOR COTTON TIP 6IN STRL (MISCELLANEOUS) ×3 IMPLANT
CATH ROBINSON RED A/P 16FR (CATHETERS) ×3 IMPLANT
CLOTH BEACON ORANGE TIMEOUT ST (SAFETY) ×3 IMPLANT
CONTAINER PREFILL 10% NBF 60ML (FORM) ×6 IMPLANT
DRAPE HYSTEROSCOPY (DRAPE) ×3 IMPLANT
DRSG TELFA 3X8 NADH (GAUZE/BANDAGES/DRESSINGS) ×3 IMPLANT
ELECT BALL LEEP 5MM RED (ELECTRODE) ×3 IMPLANT
ELECT LLETZ BALL 5MM DISP (ELECTRODE) IMPLANT
ELECT LOOP LEEP RND 15X12 GRN (CUTTING LOOP)
ELECT LOOP LEEP RND 20X12 WHT (CUTTING LOOP) ×3
ELECT REM PT RETURN 9FT ADLT (ELECTROSURGICAL) ×3
ELECTRODE LOOP LP RND 15X12GRN (CUTTING LOOP) IMPLANT
ELECTRODE LOOP LP RND 20X12WHT (CUTTING LOOP) ×2 IMPLANT
ELECTRODE REM PT RTRN 9FT ADLT (ELECTROSURGICAL) ×2 IMPLANT
EVACUATOR PREFILTER SMOKE (MISCELLANEOUS) ×3 IMPLANT
EXTENDER ELECT LOOP LEEP 10CM (CUTTING LOOP) IMPLANT
GAUZE SPONGE 4X4 16PLY XRAY LF (GAUZE/BANDAGES/DRESSINGS) IMPLANT
GLOVE BIO SURGEON STRL SZ 6.5 (GLOVE) ×3 IMPLANT
GLOVE BIOGEL PI IND STRL 7.0 (GLOVE) ×2 IMPLANT
GLOVE BIOGEL PI INDICATOR 7.0 (GLOVE) ×1
GOWN STRL REUS W/TWL LRG LVL3 (GOWN DISPOSABLE) ×6 IMPLANT
HOSE NS SMOKE EVAC 7/8 X6 (MISCELLANEOUS) ×3 IMPLANT
NEEDLE SPNL 22GX3.5 QUINCKE BK (NEEDLE) ×3 IMPLANT
NS IRRIG 1000ML POUR BTL (IV SOLUTION) ×3 IMPLANT
PACK VAGINAL MINOR WOMEN LF (CUSTOM PROCEDURE TRAY) ×3 IMPLANT
PAD OB MATERNITY 4.3X12.25 (PERSONAL CARE ITEMS) ×3 IMPLANT
PENCIL BUTTON HOLSTER BLD 10FT (ELECTRODE) ×3 IMPLANT
REDUCER FITTING SMOKE EVAC (MISCELLANEOUS) ×3 IMPLANT
SCOPETTES 8  STERILE (MISCELLANEOUS) ×2
SCOPETTES 8 STERILE (MISCELLANEOUS) ×4 IMPLANT
SET TUBING HYSTEROSCOPY 2 NDL (TUBING) ×3 IMPLANT
SYR CONTROL 10ML LL (SYRINGE) ×3 IMPLANT
TOWEL OR 17X24 6PK STRL BLUE (TOWEL DISPOSABLE) ×6 IMPLANT
TUBE HYSTEROSCOPY W Y-CONNECT (TUBING) ×3 IMPLANT
TUBING SMOKE EVAC HOSE ADAPTER (MISCELLANEOUS) ×3 IMPLANT
WATER STERILE IRR 1000ML POUR (IV SOLUTION) ×3 IMPLANT

## 2013-07-25 NOTE — Anesthesia Preprocedure Evaluation (Signed)
Anesthesia Evaluation  Patient identified by MRN, date of birth, ID band Patient awake    Reviewed: Allergy & Precautions, H&P , Patient's Chart, lab work & pertinent test results, reviewed documented beta blocker date and time   Airway Mallampati: II TM Distance: >3 FB Neck ROM: full    Dental no notable dental hx.    Pulmonary asthma (Uses inhaler daily; will use today pre-op) , former smoker,  breath sounds clear to auscultation  Pulmonary exam normal       Cardiovascular Rhythm:regular Rate:Normal     Neuro/Psych    GI/Hepatic   Endo/Other    Renal/GU      Musculoskeletal   Abdominal   Peds  Hematology   Anesthesia Other Findings Limited mouth opening; had lumpectomy since TMJ surgery  Reproductive/Obstetrics                           Anesthesia Physical Anesthesia Plan  ASA: II  Anesthesia Plan:    Post-op Pain Management:    Induction: Intravenous  Airway Management Planned: LMA  Additional Equipment:   Intra-op Plan:   Post-operative Plan:   Informed Consent: I have reviewed the patients History and Physical, chart, labs and discussed the procedure including the risks, benefits and alternatives for the proposed anesthesia with the patient or authorized representative who has indicated his/her understanding and acceptance.   Dental Advisory Given and Dental advisory given  Plan Discussed with: CRNA and Surgeon  Anesthesia Plan Comments: (Discussed GA with LMA, possible sore throat, potential need to switch to ETT, N/V, pulmonary aspiration. Questions answered. )        Anesthesia Quick Evaluation

## 2013-07-25 NOTE — Anesthesia Postprocedure Evaluation (Signed)
  Anesthesia Post-op Note  Anesthesia Post Note  Patient: Tammie Sanders  Procedure(s) Performed: Procedure(s) (LRB): DILATATION & CURETTAGE/HYSTEROSCOPY WITH NOVASURE ABLATION (N/A) LOOP ELECTROSURGICAL EXCISION PROCEDURE (LEEP) (N/A)  Anesthesia type: General  Patient location: PACU  Post pain: Pain level controlled  Post assessment: Post-op Vital signs reviewed  Last Vitals:  Filed Vitals:   07/25/13 1600  BP: 121/65  Pulse: 77  Temp: 37.3 C  Resp: 19    Post vital signs: Reviewed  Level of consciousness: sedated  Complications: No apparent anesthesia complications

## 2013-07-25 NOTE — Transfer of Care (Signed)
Immediate Anesthesia Transfer of Care Note  Patient: Tammie Sanders  Procedure(s) Performed: Procedure(s): DILATATION & CURETTAGE/HYSTEROSCOPY WITH NOVASURE ABLATION (N/A) LOOP ELECTROSURGICAL EXCISION PROCEDURE (LEEP) (N/A)  Patient Location: PACU  Anesthesia Type:General  Level of Consciousness: awake, oriented and patient cooperative  Airway & Oxygen Therapy: Patient Spontanous Breathing and Patient connected to nasal cannula oxygen  Post-op Assessment: Report given to PACU RN and Post -op Vital signs reviewed and stable  Post vital signs: Reviewed and stable  Complications: No apparent anesthesia complications

## 2013-07-25 NOTE — Discharge Instructions (Signed)
[facility] office 2-3 weeks for postop appointment.  Call the office [phone] for an appointment. ° °Personal Hygiene: °Use pads not tampons x 1week °You may shower, no tub baths or pools for 2-3 weeks °Wipe from front to back when using restroom ° °Activity: °Do not drive or operate any equipment for 24 hrs.   °Do not rest in bed all day °Walking is encouraged °Walk up and down stairs slowly °You may return to your normal activity in 1-2 days ° °Sexual Activity:  No intercourse for 2 weeks after the procedure. ° °Diet: Eat a light meal as desired this evening.  You may resume your usual diet tomorrow. ° °Return to work:  You may resume your work activities after 1-2 days ° °What to expect:  Expect to have vaginal bleeding/discharge for 2-3 days and spotting for 10-14 days.  It is not unusual to have soreness for 1-2 weeks.  You may have a slight burning sensation when you urinate for the first few days.  You may start your menses in 2-6 weeks.  Mild cramps may continue for a couple of days.   ° °Call your doctor:   °Excessive bleeding, saturating a pad every hour °Inability to urinate 6 hours after discharge °Pain not relieved with pain medications °Fever of 100.4 or greater ° °

## 2013-07-26 NOTE — Op Note (Signed)
Tammie Sanders, Tammie Sanders               ACCOUNT NO.:  [id number]  MEDICAL RECORD NO.:  [id number]  LOCATION:  [facility]                          FACILITY:  [facility]  PHYSICIAN:  Marylynn Pearson, MD    DATE OF BIRTH:  Feb 26, 1965  DATE OF PROCEDURE:  07/25/2013 DATE OF DISCHARGE:  07/25/2013                              OPERATIVE REPORT   PREOPERATIVE DIAGNOSIS: 1. Cervical dysplasia. 2. Menorrhagia.  POSTOPERATIVE DIAGNOSIS: 1. Cervical dysplasia. 2. Menorrhagia.  PROCEDURE: 1. Cervical block. 2. Hysteroscopy. 3. D and C. 4. Endometrial ablation. 5. LEEP.  SURGEON:  Marylynn Pearson, MD  ANESTHESIA:  General and local.  SPECIMEN: 1. Endometrial curettings. 2. Cervical LEEP.  COMPLICATIONS:  None.  FLUID DEFICIT:  150 mL.  BLOOD LOSS:  Less than 100 mL.  OPERATIVE PROCEDURE:  The patient was taken to the operating room after informed consent was obtained.  She was given general anesthesia. Placed in the dorsal lithotomy position using Allen stirrups.  Prepped and draped in sterile fashion.  An in and out catheter was used to drain her bladder for an unmeasured amount urine.  Bivalve-coated speculum was placed in the vagina and a single-tooth tenaculum attached to the anterior lip of the cervix.  The cervical block was performed using 1% lidocaine.  The cervix was serially dilated using Pratt dilators.  The uterus sounded to 9.5 cm.  The cervix sounded to 4 cm giving [place] a cavity length of 5.5 cm.  Diagnostic hysteroscope was inserted and a survey was performed.  Bilateral ostia were visualized and appeared normal.  Some mild polypoid tissue was noted on the right lateral and posterior wall. Hysteroscope was removed and a gentle curetting was performed.  Specimen was placed on Telfa and passed off to be sent to pathology. Hysteroscope was reinserted, no other polyps, polypoid masses, or tissue was identified.  Hysteroscope was removed.  The NovaSure was inserted, and a NovaSure  ablation was performed using standard guidelines, the rep was present in the room.  Once the cycle length was complete, the device was removed and tenaculum was removed and our attention was turned to the LEEP.  Suction hose was attached to the speculum and a single path LEEP procedure was performed without difficulty.  Specimen was placed on Telfa and marked with a suture at 12 o'clock.  The cautery loop was then attached and used to cauterize the base and edges of the cervix. Excellent hemostasis was assured.  Speculum was then removed.  Cervix was hemostatic.  She was taken to the recovery room in stable condition. Sponge, lap, needle, and instrument counts were correct x2.     Marylynn Pearson, MD     GA/MEDQ  D:  07/25/2013  T:  07/26/2013  Job:  [id number]

## 2013-07-28 ENCOUNTER — Encounter (HOSPITAL_COMMUNITY): Payer: Self-pay | Admitting: Obstetrics and Gynecology

## 2013-08-14 ENCOUNTER — Other Ambulatory Visit: Payer: Self-pay

## 2014-02-24 ENCOUNTER — Ambulatory Visit (INDEPENDENT_AMBULATORY_CARE_PROVIDER_SITE_OTHER): Payer: 59 | Admitting: Neurology

## 2014-02-24 ENCOUNTER — Encounter: Payer: Self-pay | Admitting: Neurology

## 2014-02-24 VITALS — BP 134/84 | HR 85 | Resp 14 | Ht 63.5 in | Wt 136.5 lb

## 2014-02-24 DIAGNOSIS — R519 Headache, unspecified: Secondary | ICD-10-CM | POA: Insufficient documentation

## 2014-02-24 DIAGNOSIS — G40209 Localization-related (focal) (partial) symptomatic epilepsy and epileptic syndromes with complex partial seizures, not intractable, without status epilepticus: Secondary | ICD-10-CM

## 2014-02-24 DIAGNOSIS — R51 Headache: Secondary | ICD-10-CM

## 2014-02-24 MED ORDER — GABAPENTIN 300 MG PO CAPS: 300.0000 mg | ORAL_CAPSULE | Freq: Every day | ORAL | Status: DC

## 2014-02-24 NOTE — Patient Instructions (Signed)

## 2014-02-24 NOTE — Progress Notes (Signed)
[facility]   Provider:  Larey Sanders, M D  Referring Provider: [name], MD Primary Care Physician:  Tammie Solo, MD  Chief Complaint  Patient presents with  . NP Tammie Sanders Headaches    Rm 10, alone    HPI:  Tammie Sanders is a 49 y.o. female seen here in a Consultation from Tammie Sanders for headache evaluation,  Tammie Sanders reports that she had a history of severe headaches in the morning that peaked probably around the year 2005. At the time she went through a very difficult divorce and it seems that she was confused at times when waking up out of sleep. A friend but noted her to be confused put her to bed and the next day she would awake with a headache. She saw several physicians including a neurologist for workup of these headaches until she became symptom-free on Neurontin 600 mg twice a day. Reportedly in the year 2000 she had a tonic-clonic seizure witnessed by emergency room staff in [facility]. A similar event seemed to have preceded at tongue biting and colposcopy to confusion hours earlier this in the lab panel ordered by the emergency room physicians there were no metabolic abnormality seen and an EEG that was scheduled for a day later was read as normal. She has no history of a traumatic brain injury or head injury. In February she was given Depakote but gained 20 pounds of weight and therefore switched to carbamazepine , soon after to Lamictal. Was never another seizure noted even after medications were switched. Topiramate was used but cost dysesthesias and possible some arthralgias. Carbatrol cost or rash. Neurontin wasn't prescribed as the last medication she tolerated well. She has been on Neurontin since 2002. Her last seizure was in 2005 so over 10 years ago. The patient had discontinued Neurontin in the year 2006 and is currently not on any antiepileptic medication.   But the patient correlated C onset of headaches to nocturnal convulsions of  tonic-clonic nature it is unclear what has caused her to have more frequent headaches now. There has been no interval history of seizures no interval history of traumatic brain injuries. She does not smoke she does not abuse ETOH,  her weight has been stable, her sleep is stable. She related the onset of the newer headache spell, to a severe bronchitis in 2015 ,  Asthma and sinusitis, which the patient attributed to black mold exposure. She has some headaches associated  With a  a pressure sensation, begnning on the right temple and radiating around the head like a vice- the jaw is clenched, no photophobia but nausea. and sometimes she will vomit. She noticed increased headaches under vasalva , when bending down .  She has often 2-3 days of ongoing headaches. No aura, but sensitive to olfactory stimuli. She feels that asthma and HA are related, she will sneeze and has sinus pressure, nose will run.  She has not kept a headache diary, but feels that out of 30 days,  She has 12 severe headaches and the rest of the days is rarely pain free.   She has not had a sinus CT or MRI.  She had allergy testing, but her skin as not reactive to any trigger substances.  I reviewed the patient's medication list, she is taking Singulair on daily basis at night and daily  Qvar . The rescue inhaler is the albuterol inhaler. She is taking levocetirizine 5 mg at night.   Review of Systems: Out  of a complete 14 system review, the patient complains of only the following symptoms, and all other reviewed systems are negative. The patient endorsed a runny nose and sinus allergies but she is not skin sensitive she does have asthmatic symptoms with certain triggers especially all factory. She endorsed headaches anxiety and blurred vision.  Nasal voice.      History   Social History  . Marital Status: Single    Spouse Name: N/A    Number of Children: 2  . Years of Education: college   Occupational History  . office  manager    Social History Main Topics  . Smoking status: Former Smoker -- 1.00 packs/day for 5 years    Types: Cigarettes    Quit date: 01/31/1988  . Smokeless tobacco: Never Used  . Alcohol Use: 0.0 oz/week    1-2 drink(s) per week     Comment: social  . Drug Use: No  . Sexual Activity: Not on file   Other Topics Concern  . Not on file   Social History Narrative   Caffeine 1 cup daily avg.     Family History  Problem Relation Age of Onset  . Diabetes Father   . Irritable bowel syndrome Daughter   . Colon cancer Neg Hx   . Esophageal cancer Neg Hx   . Rectal cancer Neg Hx   . Stomach cancer Neg Hx   . Asthma Mother   . Cancer Father     skin  . Cancer Maternal Grandfather     skin  . Rheum arthritis Mother   . Melanoma Maternal Grandfather   . Arthritis Mother   . Arthritis Father   . Heart disease Mother   . Heart disease Father   . Other Mother     hypoglycemia    Past Medical History  Diagnosis Date  . Anal fissure   . Skin cancer     basal cell  . Pneumonia   . PE (pulmonary embolism) 1991    After C-section  . Anxiety   . 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 Chronic headaches   . Tuberculosis     tested positive to exposure; xray clear  . PONV (postoperative nausea and vomiting)   . Asthma     Fall of 2014 [facility])     Past Surgical History  Procedure Laterality Date  . Clavicle surgery  2009    plate in right clavicle   . Cesarean section  1996, 1991  . Temporomandibular joint arthroplasty  2002    left   . Breast lumpectomy      breast  . Basal cell right chest    . Dilitation & currettage/hystroscopy with novasure ablation N/A 07/25/2013    Procedure: DILATATION & CURETTAGE/HYSTEROSCOPY WITH NOVASURE ABLATION;  Surgeon: Tammie Pearson, MD;  Location: [facility];  Service: Gynecology;  Laterality: N/A;  . Leep N/A 07/25/2013    Procedure: LOOP ELECTROSURGICAL EXCISION PROCEDURE (LEEP);  Surgeon:  Tammie Pearson, MD;  Location: [facility];  Service: Gynecology;  Laterality: N/A;  . Basal cell carcinoma excision  2008    Current Outpatient Prescriptions  Medication Sig Dispense Refill  . ACETAMINOPHEN-BUTALBITAL 50-325 MG TABS Take 2 tablets by mouth daily as needed (headaches).    . beclomethasone (QVAR) 80 MCG/ACT inhaler Inhale 1 puff into the lungs 2 (two) times daily. 1 Inhaler 1  . benzonatate (TESSALON) 200 MG capsule Take 200 mg by mouth  as needed.   0  . DYMISTA 137-50 MCG/ACT SUSP Place 1 puff into the nose 2 (two) times daily.   5  . fish oil-omega-3 fatty acids 1000 MG capsule Take 2 g by mouth daily.      Marland Kitchen FLUoxetine (PROZAC) 20 MG capsule Take 20 mg by mouth daily.     . hydrocortisone cream 1 % Apply 1 application topically daily as needed for itching.    . levocetirizine (XYZAL) 5 MG tablet Take 5 mg by mouth every evening.    Marland Kitchen MAGNESIUM PO Take 1 tablet by mouth daily. 250mg     . MELATONIN PO Take by mouth as needed. 200mg     . montelukast (SINGULAIR) 10 MG tablet Take 10 mg by mouth at bedtime.    . Multiple Vitamin (MULTIVITAMIN PO) Take by mouth.      . ondansetron (ZOFRAN-ODT) 4 MG disintegrating tablet   2  . Probiotic Product (PROBIOTIC DAILY PO) Take 1 tablet by mouth daily. OTC     No current facility-administered medications for this visit.    Allergies as of 02/24/2014 - Review Complete 02/24/2014  Allergen Reaction Noted  . Depakote [divalproex sodium] Other (See Comments) 02/24/2014  . Topamax [topiramate] Other (See Comments) 02/24/2014  . Tegretol [carbamazepine] Rash 02/24/2014    Vitals: BP 134/84 mmHg  Pulse 85  Resp 14  Ht 5' 3.5" (1.613 m)  Wt 136 lb 8 oz (61.916 kg)  BMI 23.80 kg/m2 Last Weight:  Wt Readings from Last 1 Encounters:  02/24/14 136 lb 8 oz (61.916 kg)       Last Height:   Ht Readings from Last 1 Encounters:  02/24/14 5' 3.5" (1.613 m)    Physical exam:  General: The patient is awake, alert and appears not in acute  distress. The patient is well groomed. Head: Normocephalic, atraumatic. Neck is supple. Mallampati3 ,  neck circumference: 13.75  Nasal airflow  Unrestricted , TMJ is evident, she had a traumatic jaw dislocation . Retrognathia is  Not seen.  Bruxism reported.  Cardiovascular:  Regular rate and rhythm , without  murmurs or carotid bruit, and without distended neck veins. Respiratory: Lungs are clear to auscultation. Skin:  Without evidence of edema, or rash Trunk: BMI is  elevated and patient  has normal posture.  Neurologic exam : The patient is awake and alert, oriented to place and time.   Memory subjective described as intact.  There is a normal attention span & concentration ability.  Speech is fluent without  dysarthria, dysphonia or aphasia. Mood and affect are appropriate.  Cranial nerves: Pupils are equal and briskly reactive to light. Funduscopic exam without  evidence of pallor or edema.  Extraocular movements  in vertical and horizontal planes intact and without nystagmus. Visual fields by finger perimetry are intact. Hearing to finger rub intact. Facial sensation intact to fine touch. Facial motor strength is symmetric and tongue and uvula move midline.  Motor exam:   Normal tone ,muscle bulk and symmetric ,strength in all extremities.  Sensory:  Fine touch, pinprick and vibration ,Proprioception :normal.  Coordination: Rapid alternating movements in the fingers/hands is normal.  Finger-to-nose maneuver normal without evidence of ataxia, dysmetria or tremor.  Gait and station: Patient walks without assistive device and is able unassisted to climb up to the exam table.  Strength within normal limits. Stance is stable and normal. Tandem gait is unfragmented. Romberg  negative.  Deep tendon reflexes: in the  upper and lower extremities are brisk and  Babinski maneuver response is  downgoing.   Assessment:  After physical and neurologic examination, review of laboratory  studies, imaging, neurophysiology testing and pre-existing records, assessment is   1)  this patient noticed sleep related headaches first during the time of a very adequate pneumonia is divorce and it is possible that the stressors at the time I related to the spells she suffered at the time. A seizure disorder has not been evident in over a decade now and the patient would not need to be on any seizure medication.   #2 her current headaches seem to have been triggered by bronchitis, sinusitis and allergic asthmatic reaction to black mold. She is taking daily antihistamines to reduce the exposure frequency. She still has almost daily headaches so it is necessary to evaluate the origin of the headaches further.   Some of the symptoms just as his severe nausea that can even lead to vomiting a usually seen with vascular headaches or migraines.   Headache onset after allergic reaction can also be related to a sinus infection and if the is infections are still present but may be related to the exposure of black mold.   I will order a MRI of the brain and will ask the radiologist to make a special note on the frontal ethmoidal and maxillary sinuses. I would like to change her medication only briefly by concern is that so none of the current medication seems to be a headache preventative as the patient describes more frequent and longer lasting headaches. In the past she had less headaches while being treated with Neurontin -so it would be reasonable prevention to start the patient on this medication,  but  at a lower dose she took in the past.     The patient was advised of the nature of the diagnosed sleep disorder , the treatment options and risks for general a health and wellness arising from not treating the condition. Consultation  duration was 60 minutes.  The face to face time was spent to over 50% with discussion of  the possible differential diagnoses, the necessity for further testing and what  these tests will consist of as well as treatment options depending on the outcome. Sinusitis versus migraine , versus cluster.   Plan:  Treatment plan and additional workup : MRI brain , with and without.  Neurontin 300 mg at night po.                      Asencion Partridge Sulema Braid MD  02/24/2014

## 2014-02-25 LAB — COMPREHENSIVE METABOLIC PANEL
A/G RATIO: 1.7 (ref 1.1–2.5)
ALK PHOS: 69 IU/L (ref 39–117)
ALT: 19 IU/L (ref 0–32)
AST: 23 IU/L (ref 0–40)
Albumin: 4.5 g/dL (ref 3.5–5.5)
BUN/Creatinine Ratio: 12 (ref 9–23)
BUN: 8 mg/dL (ref 6–24)
CO2: 26 mmol/L (ref 18–29)
CREATININE: 0.68 mg/dL (ref 0.57–1.00)
Calcium: 9.7 mg/dL (ref 8.7–10.2)
Chloride: 98 mmol/L (ref 97–108)
GFR calc Af Amer: 119 mL/min/{1.73_m2} (ref >59)
GFR calc non Af Amer: 103 mL/min/{1.73_m2} (ref >59)
GLOBULIN, TOTAL: 2.7 g/dL (ref 1.5–4.5)
Glucose: 98 mg/dL (ref 65–99)
POTASSIUM: 4.1 mmol/L (ref 3.5–5.2)
SODIUM: 138 mmol/L (ref 134–144)
TOTAL PROTEIN: 7.2 g/dL (ref 6.0–8.5)
Total Bilirubin: <0.2 mg/dL (ref 0.0–1.2)

## 2014-03-05 ENCOUNTER — Encounter: Payer: Self-pay | Admitting: *Deleted

## 2014-03-12 ENCOUNTER — Ambulatory Visit (INDEPENDENT_AMBULATORY_CARE_PROVIDER_SITE_OTHER): Payer: 59

## 2014-03-12 DIAGNOSIS — R519 Headache, unspecified: Secondary | ICD-10-CM

## 2014-03-12 DIAGNOSIS — R51 Headache: Secondary | ICD-10-CM

## 2014-03-12 DIAGNOSIS — G40209 Localization-related (focal) (partial) symptomatic epilepsy and epileptic syndromes with complex partial seizures, not intractable, without status epilepticus: Secondary | ICD-10-CM

## 2014-03-13 MED ORDER — GADOPENTETATE DIMEGLUMINE 469.01 MG/ML IV SOLN: 13.0000 mL | Freq: Once | INTRAVENOUS | Status: AC | PRN

## 2014-03-18 ENCOUNTER — Encounter: Payer: Self-pay | Admitting: Adult Health

## 2014-03-18 ENCOUNTER — Ambulatory Visit (INDEPENDENT_AMBULATORY_CARE_PROVIDER_SITE_OTHER): Payer: 59 | Admitting: Adult Health

## 2014-03-18 VITALS — BP 113/72 | HR 80 | Ht 63.0 in | Wt 135.0 lb

## 2014-03-18 DIAGNOSIS — G935 Compression of brain: Secondary | ICD-10-CM

## 2014-03-18 DIAGNOSIS — J328 Other chronic sinusitis: Secondary | ICD-10-CM

## 2014-03-18 DIAGNOSIS — G44221 Chronic tension-type headache, intractable: Secondary | ICD-10-CM

## 2014-03-18 MED ORDER — GABAPENTIN 300 MG PO CAPS: 300.0000 mg | ORAL_CAPSULE | Freq: Two times a day (BID) | ORAL | Status: DC

## 2014-03-18 MED ORDER — PREDNISONE 5 MG PO TABS: ORAL_TABLET | ORAL | Status: DC

## 2014-03-18 NOTE — Progress Notes (Signed)
I agree with the assessment and plan as directed by NP .The patient is known to me .   Keiran Sias, MD  

## 2014-03-18 NOTE — Progress Notes (Signed)
PATIENT: Tammie Sanders DOB: [birth year]  REASON FOR VISIT: follow up- Headaches, sinusitis, HISTORY FROM: patient  HISTORY OF PRESENT ILLNESS: Tammie Sanders is a 49 year old female with a history of headaches and seizure event. She returns today for follow-up. At the last visit the patient was started on Neurontin for her headaches. She reports this has the Neurontin th has helped the severity of her headaches but has not resolved the headaches. She has a continuous headache. It varies in severity. Sometimes it's just pressure and other days she can't function. She states that it starts in the forehead and wraps around the head. She describes it has a squeezing  band around the head. She did go to the eye MD and had intraocular HTN but it is in normal range. They are just going to monitor this for now. She will have photophobia but not phonophobia. She reports some nausea but that has improved with Neurontin.  Patient states that she has an headache now- she rates her pain as a  6/10- located in the frontal region radiating to the temporal regions bilaterally. She has photophobia now, denies nausea. Patient reports that she has allergic rhinitis- she sees a allergist. She states that she has constant nasal congestion and pressure. She states that it feels like she has cotton in her sinuses. The patient also had an MRI of the brain that was unremarkable but did show an incidental finding of chronic severe paranasal sinusitis and type I Arnold Chiari malformation.  HISTORY 02/24/14 [facility]): Tammie Sanders reports that she had a history of severe headaches in the morning that peaked probably around the year 2005. At the time she went through a very difficult divorce and it seems that she was confused at times when waking up out of sleep. A friend but noted her to be confused put her to bed and the next day she would awake with a headache. She saw several physicians including a neurologist for workup of  these headaches until she became symptom-free on Neurontin 600 mg twice a day. Reportedly in the year 2000 she had a tonic-clonic seizure witnessed by emergency room staff in [facility]. A similar event seemed to have preceded at tongue biting and colposcopy to confusion hours earlier this in the lab panel ordered by the emergency room physicians there were no metabolic abnormality seen and an EEG that was scheduled for a day later was read as normal. She has no history of a traumatic brain injury or head injury. In February she was given Depakote but gained 20 pounds of weight and therefore switched to carbamazepine , soon after to Lamictal. Was never another seizure noted even after medications were switched. Topiramate was used but cost dysesthesias and possible some arthralgias. Carbatrol cost or rash. Neurontin wasn't prescribed as the last medication she tolerated well. She has been on Neurontin since 2002. Her last seizure was in 2005 so over 10 years ago. The patient had discontinued Neurontin in the year 2006 and is currently not on any antiepileptic medication. But the patient correlated C onset of headaches to nocturnal convulsions of tonic-clonic nature it is unclear what has caused her to have more frequent headaches now. There has been no interval history of seizures no interval history of traumatic brain injuries. She does not smoke she does not abuse ETOH,  her weight has been stable, her sleep is stable. She related the onset of the newer headache spell, to a severe bronchitis in 2015 ,  Asthma  and sinusitis, which the patient attributed to black mold exposure. She has some headaches associated  With a  a pressure sensation, beginning on the right temple and radiating around the head like a vice- the jaw is clenched, no photophobia but nausea. and sometimes she will vomit. She noticed increased headaches under vasalva , when bending down .  She has often 2-3 days of ongoing headaches. No  aura, but sensitive to olfactory stimuli. She feels that asthma and HA are related, she will sneeze and has sinus pressure, nose will run.   She has not kept a headache diary, but feels that out of 30 days,  She has 12 severe headaches and the rest of the days is rarely pain free.   She has not had a sinus CT or MRI.   She had allergy testing, but her skin as not reactive to any trigger substances.  I reviewed the patient's medication list, she is taking Singulair on daily basis at night and daily  Qvar . The rescue inhaler is the albuterol inhaler. She is taking levocetirizine 5 mg at night.  REVIEW OF SYSTEMS: Out of a complete 14 system review of symptoms, the patient complains only of the following symptoms, and all other reviewed systems are negative.  Environmental allergies, runny nose, headache  ALLERGIES: Allergies  Allergen Reactions  . Depakote [Divalproex Sodium] Other (See Comments)    Weight gain  . Topamax [Topiramate] Other (See Comments)    Muscle pain  . Tegretol [Carbamazepine] Rash    HOME MEDICATIONS: Outpatient Prescriptions Prior to Visit  Medication Sig Dispense Refill  . ACETAMINOPHEN-BUTALBITAL 50-325 MG TABS Take 2 tablets by mouth daily as needed (headaches).    . beclomethasone (QVAR) 80 MCG/ACT inhaler Inhale 1 puff into the lungs 2 (two) times daily. 1 Inhaler 1  . benzonatate (TESSALON) 200 MG capsule Take 200 mg by mouth as needed.   0  . DYMISTA 137-50 MCG/ACT SUSP Place 1 puff into the nose 2 (two) times daily.   5  . fish oil-omega-3 fatty acids 1000 MG capsule Take 2 g by mouth daily.      Marland Kitchen FLUoxetine (PROZAC) 20 MG capsule Take 20 mg by mouth daily.     Marland Kitchen gabapentin (NEURONTIN) 300 MG capsule Take 1 capsule (300 mg total) by mouth at bedtime. 30 capsule 11  . hydrocortisone cream 1 % Apply 1 application topically daily as needed for itching.    . levocetirizine (XYZAL) 5 MG tablet Take 5 mg by mouth every evening.    Marland Kitchen MAGNESIUM PO Take 1 tablet  by mouth daily. 250mg     . MELATONIN PO Take by mouth as needed. 200mg     . montelukast (SINGULAIR) 10 MG tablet Take 10 mg by mouth at bedtime.    . Multiple Vitamin (MULTIVITAMIN PO) Take by mouth.      . ondansetron (ZOFRAN-ODT) 4 MG disintegrating tablet   2  . Probiotic Product (PROBIOTIC DAILY PO) Take 1 tablet by mouth daily. OTC     No facility-administered medications prior to visit.    PAST MEDICAL HISTORY: Past Medical History  Diagnosis Date  . Anal fissure   . Skin cancer     basal cell  . Pneumonia   . PE (pulmonary embolism) 1991    After C-section  . Anxiety   . 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 Chronic headaches   . Tuberculosis     tested  positive to exposure; xray clear  . PONV (postoperative nausea and vomiting)   . Asthma     Fall of 2014 [facility])     PAST SURGICAL HISTORY: Past Surgical History  Procedure Laterality Date  . Clavicle surgery  2009    plate in right clavicle   . Cesarean section  1996, 1991  . Temporomandibular joint arthroplasty  2002    left   . Breast lumpectomy      breast  . Basal cell right chest    . Dilitation & currettage/hystroscopy with novasure ablation N/A 07/25/2013    Procedure: DILATATION & CURETTAGE/HYSTEROSCOPY WITH NOVASURE ABLATION;  Surgeon: Marylynn Pearson, MD;  Location: [facility];  Service: Gynecology;  Laterality: N/A;  . Leep N/A 07/25/2013    Procedure: LOOP ELECTROSURGICAL EXCISION PROCEDURE (LEEP);  Surgeon: Marylynn Pearson, MD;  Location: [place] [facility];  Service: Gynecology;  Laterality: N/A;  . Basal cell carcinoma excision  2008    FAMILY HISTORY: Family History  Problem Relation Age of Onset  . Diabetes Father   . Irritable bowel syndrome Daughter   . Colon cancer Neg Hx   . Esophageal cancer Neg Hx   . Rectal cancer Neg Hx   . Stomach cancer Neg Hx   . Asthma Mother   . Cancer Father     skin  . Cancer Maternal Grandfather     skin  . Rheum arthritis  Mother   . Melanoma Maternal Grandfather   . Arthritis Mother   . Arthritis Father   . Heart disease Mother   . Heart disease Father   . Other Mother     hypoglycemia    PHYSICAL EXAM  Filed Vitals:   03/18/14 0830  BP: 113/72  Pulse: 80  Height: 5\' 3"  (1.6 m)  Weight: 135 lb (61.236 kg)   Body mass index is 23.92 kg/(m^2).  Generalized: Well developed, in no acute distress  HEENT; maxillary sinuses tender to palpation bilaterally. Mild tenderness in the frontal sinuses to palpitation.  Neurological examination  Mentation: Alert oriented to time, place, history taking. Follows all commands speech and language fluent Cranial nerve II-XII: Pupils were equal round reactive to light. Extraocular movements were full, visual field were full on confrontational test. Facial sensation and strength were normal. Uvula tongue midline. Head turning and shoulder shrug  were normal and symmetric. Motor: The motor testing reveals 5 over 5 strength of all 4 extremities. Good symmetric motor tone is noted throughout.  Sensory: Sensory testing is intact to soft touch on all 4 extremities. No evidence of extinction is noted.  Coordination: Cerebellar testing reveals good finger-nose-finger and heel-to-shin bilaterally.  Gait and station: Gait is normal. Tandem gait is normal. Romberg is negative. No drift is seen.  Reflexes: Deep tendon reflexes are symmetric and normal bilaterally.     DIAGNOSTIC DATA (LABS, IMAGING, TESTING) - I reviewed patient records, labs, notes, testing and imaging myself where available.  MRI of the brain 03/15/14:   IMPRESSION: Unremarkable MRI scan of the brain with and without contrast. Incidental changes of chronic severe paranasal sinusitis and type I Arnold Chiari malformation    ASSESSMENT AND PLAN 49 y.o. year old female  has a past medical history of Anal fissure; Skin cancer; Pneumonia; PE (pulmonary embolism) (1991); Anxiety; Seizures; Chronic headaches;  Tuberculosis; PONV (postoperative nausea and vomiting); and Asthma. here with:  1. Headache 2. Chronic sinusitis 3. Type I  Arnold Chiari malformation  Patient has continued to have a daily headache. She  states that gabapentin has helped with the severity but has not resolved the headaches. I will increase gabapentin to 300 mg twice a day. Patient will also be given a prednisone Dosepak- hopefully this will break the cycle of her ongoing headache. Patient's MRI did confirm severe chronic sinusitis. She is currently being seen by an allergist but has not been evaluated by an ENT specialist. I will make a referral for ENT. Patient's description of her headaches seem to be consistent with a tension-type headache. However her headaches could also be related to her chronic sinusitis. I also explained in detail about the type I Chiari malformation- I explained the anatomy, signs and symptoms and treatment. For now we will continue to monitor this. The patient will follow-up in 3 months or sooner if needed.   Ward Givens, MSN, NP-C 03/18/2014, 8:44 AM [facility] [address] [place],  [address] [phone]  Note: This document was prepared with digital dictation and possible smart phrase technology. Any transcriptional errors that result from this process are unintentional.

## 2014-03-18 NOTE — Patient Instructions (Signed)
Increase gabapentin to 300 mg BID.  Begin prednisone dosepak.  Referral to ENT sent.  Please let us know if your headaches do not improve.

## 2014-03-19 NOTE — Progress Notes (Signed)
Quick Note:  Called patient relayed MRI was normal. Chronic sinusitis . ______

## 2014-03-25 ENCOUNTER — Ambulatory Visit: Payer: 59 | Admitting: Adult Health

## 2014-04-04 ENCOUNTER — Encounter: Payer: Self-pay | Admitting: Emergency Medicine

## 2014-04-04 ENCOUNTER — Emergency Department
Admission: EM | Admit: 2014-04-04 | Discharge: 2014-04-04 | Disposition: A | Discharge status: Home / Self Care | Payer: 59 | Source: Home / Self Care | Attending: Family Medicine | Admitting: Family Medicine

## 2014-04-04 DIAGNOSIS — T7840XA Allergy, unspecified, initial encounter: Secondary | ICD-10-CM

## 2014-04-04 HISTORY — DX: Chronic sinusitis, unspecified: J32.9

## 2014-04-04 MED ORDER — METHYLPREDNISOLONE SODIUM SUCC 125 MG IJ SOLR
125.0000 mg | Freq: Once | INTRAMUSCULAR | Status: AC
  Administered 2014-04-04: 125 mg via INTRAMUSCULAR

## 2014-04-04 MED ORDER — EPINEPHRINE 0.3 MG/0.3ML IJ SOAJ: 0.3000 mg | Freq: Once | INTRAMUSCULAR | Status: AC

## 2014-04-04 MED ORDER — GI COCKTAIL ~~LOC~~
30.0000 mL | Freq: Once | ORAL | Status: AC
  Administered 2014-04-04: 30 mL via ORAL

## 2014-04-04 MED ORDER — PREDNISONE 5 MG PO KIT: PACK | ORAL | Status: DC

## 2014-04-04 NOTE — Discharge Instructions (Signed)
Thank you for coming in today. STOP clindamycin.  Start prednisone tomorrow.  Take benadryl every 6 hours.  Also take Zantac or Pepcid twice daily for a few days. Use the EpiPen if you get worse. Call or go to the emergency room if you get worse, have trouble breathing, have chest pains, or palpitations.   Anaphylactic Reaction An anaphylactic reaction is a sudden, severe allergic reaction that involves the whole body. It can be life threatening. A hospital stay is often required. People with asthma, eczema, or hay fever are slightly more likely to have an anaphylactic reaction. CAUSES  An anaphylactic reaction may be caused by anything to which you are allergic. After being exposed to the allergic substance, your immune system becomes sensitized to it. When you are exposed to that allergic substance again, an allergic reaction can occur. Common causes of an anaphylactic reaction include:  Medicines.  Foods, especially peanuts, wheat, shellfish, milk, and eggs.  Insect bites or stings.  Blood products.  Chemicals, such as dyes, latex, and contrast material used for imaging tests. SYMPTOMS  When an allergic reaction occurs, the body releases histamine and other substances. These substances cause symptoms such as tightening of the airway. Symptoms often develop within seconds or minutes of exposure. Symptoms may include:  Skin rash or hives.  Itching.  Chest tightness.  Swelling of the eyes, tongue, or lips.  Trouble breathing or swallowing.  Lightheadedness or fainting.  Anxiety or confusion.  Stomach pains, vomiting, or diarrhea.  Nasal congestion.  A fast or irregular heartbeat (palpitations). DIAGNOSIS  Diagnosis is based on your history of recent exposure to allergic substances, your symptoms, and a physical exam. Your caregiver may also perform blood or urine tests to confirm the diagnosis. TREATMENT  Epinephrine medicine is the main treatment for an anaphylactic  reaction. Other medicines that may be used for treatment include antihistamines, steroids, and albuterol. In severe cases, fluids and medicine to support blood pressure may be given through an intravenous line (IV). Even if you improve after treatment, you need to be observed to make sure your condition does not get worse. This may require a stay in the hospital. [place] a medical alert bracelet or necklace stating your allergy.  You and your family must learn how to use an anaphylaxis kit or give an epinephrine injection to temporarily treat an emergency allergic reaction. Always carry your epinephrine injection or anaphylaxis kit with you. This can be lifesaving if you have a severe reaction.  Do not drive or perform tasks after treatment until the medicines used to treat your reaction have worn off, or until your caregiver says it is okay.  If you have hives or a rash:  Take medicines as directed by your caregiver.  You may use an over-the-counter antihistamine (diphenhydramine) as needed.  Apply cold compresses to the skin or take baths in cool water. Avoid hot baths or showers. SEEK MEDICAL CARE IF:   You develop symptoms of an allergic reaction to a new substance. Symptoms may start right away or minutes later.  You develop a rash, hives, or itching.  You develop new symptoms. SEEK IMMEDIATE MEDICAL CARE IF:   You have swelling of the mouth, difficulty breathing, or wheezing.  You have a tight feeling in your chest or throat.  You develop hives, swelling, or itching all over your body.  You develop severe vomiting or diarrhea.  You feel faint or pass out. This is an emergency. Use your  epinephrine injection or anaphylaxis kit as you have been instructed. Call your local emergency services (911 in [place] Even if you improve after the injection, you need to be examined at a hospital emergency department. MAKE SURE YOU:   Understand these  instructions.  Will watch your condition.  Will get help right away if you are not doing well or get worse. Document Released: 01/16/2005 Document Revised: 01/21/2013 Document Reviewed: 04/19/2011 [facility] Patient Information 2015 [place], [place]. This information is not intended to replace advice given to you by your health care provider. Make sure you discuss any questions you have with your health care provider.

## 2014-04-04 NOTE — ED Provider Notes (Signed)
Tammie Sanders is a 49 y.o. female who presents to Urgent Care today for allergic reaction.  Patient has been diagnosed with chronic sinusitis and is currently undergoing a trial of antibiotics. A few days ago she was started on Levaquin but immediately developed facial swelling and rash. This medication was discontinued. She started clindamycin yesterday and felt well. However this morning she feels itchy and noticed a scratchy throat and ear pressure. She also feels nauseated and feels a pressure sensation in her chest. She denies any outright chest pain. She notes belching as well and feels nauseated. She denies any exertional pain. She took some Benadryl which seemed to help. She has not taken clindamycin again.   Past Medical History  Diagnosis Date  . Anal fissure   . Skin cancer     basal cell  . Pneumonia   . PE (pulmonary embolism) 1991    After C-section  . Anxiety   . 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 Chronic headaches   . Tuberculosis     tested positive to exposure; xray clear  . PONV (postoperative nausea and vomiting)   . Asthma     Fall of 2014 [facility])    Past Surgical History  Procedure Laterality Date  . Clavicle surgery  2009    plate in right clavicle   . Cesarean section  1996, 1991  . Temporomandibular joint arthroplasty  2002    left   . Breast lumpectomy      breast  . Basal cell right chest    . Dilitation & currettage/hystroscopy with novasure ablation N/A 07/25/2013    Procedure: DILATATION & CURETTAGE/HYSTEROSCOPY WITH NOVASURE ABLATION;  Surgeon: Marylynn Pearson, MD;  Location: [facility];  Service: Gynecology;  Laterality: N/A;  . Leep N/A 07/25/2013    Procedure: LOOP ELECTROSURGICAL EXCISION PROCEDURE (LEEP);  Surgeon: Marylynn Pearson, MD;  Location: [facility];  Service: Gynecology;  Laterality: N/A;  . Basal cell carcinoma excision  2008   History  Substance Use Topics  . Smoking status: Former Smoker -- 1.00  packs/day for 5 years    Types: Cigarettes    Quit date: 01/31/1988  . Smokeless tobacco: Never Used  . Alcohol Use: 0.0 oz/week    1-2 drink(s) per week     Comment: social   ROS as above Medications: Current Facility-Administered Medications  Medication Dose Route Frequency Provider Last Rate Last Dose  . gi cocktail (Maalox,Lidocaine,Donnatal)  30 mL Oral Once Gregor Hams, MD      . methylPREDNISolone sodium succinate (SOLU-MEDROL) 125 mg/2 mL injection 125 mg  125 mg Intramuscular Once Gregor Hams, MD       Current Outpatient Prescriptions  Medication Sig Dispense Refill  . ACETAMINOPHEN-BUTALBITAL 50-325 MG TABS Take 2 tablets by mouth daily as needed (headaches).    . beclomethasone (QVAR) 80 MCG/ACT inhaler Inhale 1 puff into the lungs 2 (two) times daily. 1 Inhaler 1  . benzonatate (TESSALON) 200 MG capsule Take 200 mg by mouth as needed.   0  . DYMISTA 137-50 MCG/ACT SUSP Place 1 puff into the nose 2 (two) times daily.   5  . fish oil-omega-3 fatty acids 1000 MG capsule Take 2 g by mouth daily.      Marland Kitchen FLUoxetine (PROZAC) 20 MG capsule Take 20 mg by mouth daily.     Marland Kitchen gabapentin (NEURONTIN) 300 MG capsule Take 1 capsule (300 mg total) by mouth 2 (two)  times daily. 60 capsule 11  . hydrocortisone cream 1 % Apply 1 application topically daily as needed for itching.    . levocetirizine (XYZAL) 5 MG tablet Take 5 mg by mouth every evening.    Marland Kitchen MAGNESIUM PO Take 1 tablet by mouth daily. 250mg     . MELATONIN PO Take by mouth as needed. 200mg     . montelukast (SINGULAIR) 10 MG tablet Take 10 mg by mouth at bedtime.    . Multiple Vitamin (MULTIVITAMIN PO) Take by mouth.      . ondansetron (ZOFRAN-ODT) 4 MG disintegrating tablet   2  . predniSONE (DELTASONE) 5 MG tablet Begin taking 6 tablets daily, taper by one tablet daily until off the medication. 21 tablet 0  . Probiotic Product (PROBIOTIC DAILY PO) Take 1 tablet by mouth daily. OTC     Allergies  Allergen Reactions  .  Depakote [Divalproex Sodium] Other (See Comments)    Weight gain  . Topamax [Topiramate] Other (See Comments)    Muscle pain  . Tegretol [Carbamazepine] Rash     Exam:  BP 152/91 mmHg  Pulse 78  Temp(Src) 98.3 F (36.8 C) (Oral)  Resp 18  SpO2 98% Gen: Well NAD HEENT: EOMI,  MMM no tongue or lip swelling. Normal tympanic membranes. Clear nasal discharge. Lungs: Normal work of breathing. CTABL Heart: RRR no MRG Abd: NABS, Soft. Nondistended, Nontender Exts: Brisk capillary refill, warm and well perfused.   skin: No significant rash  Patient was given a GI cocktail, and 125 mg of Solu-Medrol IM. She felt better after 30 minutes.  12-lead EKG: Sinus rhythm at 75 bpm. No ST segment elevation or depression. T waves are normal appearing. PR interval is 108. QTC 424. Impression slight short PR interval.    No results found for this or any previous visit (from the past 24 hour(s)). No results found.  Assessment and Plan: 49 y.o. female with allergic reaction to antibiotics. Patient does not quite meet the criteria for anaphylaxis. She appears to be doing better after steroids. Plan to treat with H1, and H2 blockers as well as a prednisone Dosepak. I have also prescribed an EpiPen should she worsen. She is to discontinue antibiotics and follow up with primary care.  Discussed warning signs or symptoms. Please see discharge instructions. Patient expresses understanding.     Gregor Hams, MD 04/04/14 [phone]

## 2014-04-04 NOTE — ED Notes (Signed)
Patient believes she is having allergic reaction to Clindamycin: took dose last night and was fine until this morning when general itching, redness of face, watering eyes and sense of indigestion began. Reports even stronger reaction to Levoquin 5 days ago. Has been evaluated for sinusitis and was being place on 30 days of antibiotics in hopes of clearing; if unsuccessful her ENT will do sinus surgery. No dyspnea. Took benadryl 50mg  po 30 minutes ago.

## 2014-04-06 ENCOUNTER — Telehealth: Payer: Self-pay | Admitting: Emergency Medicine

## 2014-06-16 ENCOUNTER — Ambulatory Visit: Payer: 59 | Admitting: Neurology

## 2014-10-15 ENCOUNTER — Telehealth: Payer: Self-pay | Admitting: *Deleted

## 2014-10-15 NOTE — Telephone Encounter (Signed)
Patient requested medical records at front desk 10/15/14.

## 2014-10-20 ENCOUNTER — Other Ambulatory Visit: Payer: Self-pay | Admitting: Obstetrics and Gynecology

## 2014-10-21 LAB — CYTOLOGY - PAP

## 2015-03-08 ENCOUNTER — Telehealth: Payer: Self-pay | Admitting: Internal Medicine

## 2015-03-08 NOTE — Telephone Encounter (Signed)
Patient states she has had bright red blood in stool and toilet for 3-4 weeks.  Also reports rectal pain that is so bad she has nausea.She has just changed jobs and could not come for [place] until now. Scheduled with Lori Hvozdovic, PA-C on 03/16/15 at 8:45 AM.

## 2015-03-16 ENCOUNTER — Ambulatory Visit: Payer: 59 | Admitting: Physician Assistant

## 2015-05-28 ENCOUNTER — Telehealth: Payer: Self-pay | Admitting: Family Medicine

## 2015-05-28 NOTE — Telephone Encounter (Signed)
I received a letter from [employer] company regarding the EpiPen recall. She is aware of this and has checked her device against the recall and it is not affected.

## 2018-03-20 ENCOUNTER — Institutional Professional Consult (permissible substitution): Payer: 59 | Admitting: Pulmonary Disease

## 2018-03-21 ENCOUNTER — Ambulatory Visit: Payer: No Typology Code available for payment source | Admitting: Pulmonary Disease

## 2018-03-21 ENCOUNTER — Encounter: Payer: Self-pay | Admitting: Pulmonary Disease

## 2018-03-21 VITALS — BP 132/80 | HR 89 | Ht 64.0 in | Wt 148.4 lb

## 2018-03-21 DIAGNOSIS — R059 Cough, unspecified: Secondary | ICD-10-CM

## 2018-03-21 DIAGNOSIS — J4551 Severe persistent asthma with (acute) exacerbation: Secondary | ICD-10-CM | POA: Diagnosis not present

## 2018-03-21 DIAGNOSIS — R05 Cough: Secondary | ICD-10-CM

## 2018-03-21 DIAGNOSIS — J849 Interstitial pulmonary disease, unspecified: Secondary | ICD-10-CM | POA: Diagnosis not present

## 2018-03-21 LAB — NITRIC OXIDE: Nitric Oxide: 103

## 2018-03-21 LAB — CBC WITH DIFFERENTIAL/PLATELET
Basophils Absolute: 0.1 10*3/uL (ref 0.0–0.1)
Basophils Relative: 0.8 % (ref 0.0–3.0)
Eosinophils Absolute: 0.7 10*3/uL (ref 0.0–0.7)
Eosinophils Relative: 10.7 % — ABNORMAL HIGH (ref 0.0–5.0)
HEMATOCRIT: 43 % (ref 36.0–46.0)
Hemoglobin: 14.7 g/dL (ref 12.0–15.0)
Lymphocytes Relative: 26 % (ref 12.0–46.0)
Lymphs Abs: 1.7 10*3/uL (ref 0.7–4.0)
MCHC: 34.2 g/dL (ref 30.0–36.0)
MCV: 92.8 fl (ref 78.0–100.0)
MONOS PCT: 7.8 % (ref 3.0–12.0)
Monocytes Absolute: 0.5 10*3/uL (ref 0.1–1.0)
Neutro Abs: 3.6 10*3/uL (ref 1.4–7.7)
Neutrophils Relative %: 54.7 % (ref 43.0–77.0)
Platelets: 264 10*3/uL (ref 150.0–400.0)
RBC: 4.63 Mil/uL (ref 3.87–5.11)
RDW: 12.5 % (ref 11.5–15.5)
WBC: 6.6 10*3/uL (ref 4.0–10.5)

## 2018-03-21 MED ORDER — PREDNISONE 10 MG PO TABS
ORAL_TABLET | ORAL | 0 refills | Status: DC
Start: 2018-03-21 — End: 2018-04-18

## 2018-03-21 MED ORDER — METHYLPREDNISOLONE ACETATE 80 MG/ML IJ SUSP
80.0000 mg | Freq: Once | INTRAMUSCULAR | Status: AC
  Administered 2018-03-21: 80 mg via INTRAMUSCULAR

## 2018-03-21 NOTE — Addendum Note (Signed)
Addended by: Maryanna Shape A on: 03/21/2018 03:58 PM   Modules accepted: Orders

## 2018-03-21 NOTE — Addendum Note (Signed)
Addended by: Suzzanne Cloud E on: 03/21/2018 03:58 PM   Modules accepted: Orders

## 2018-03-21 NOTE — Patient Instructions (Addendum)
We will check some labs today including CBC differential, IgE, ANA, rheumatoid factor, CCP, mold antibody panel, hypersensitivity panel, ANCA panel Schedule you for pulmonary function tests and high-resolution CT of the chest  Continue your inhalers including Symbicort and Spiriva, nebulizers as needed We will give you a Depo injection today and start you on a prednisone taper starting at 60 mg.  Reduce dose by 10 mg every 4 days until you reach dose of 20 mg and hold at that dose until you can be reevaluated.  Follow-up in 4 weeks.

## 2018-03-21 NOTE — Progress Notes (Signed)
Tammie Sanders    [id number]    Jul 26, 1965  Primary Care Physician:Miller, Lattie Haw, MD  Referring Physician: [name], MD [place], [place] [address]  Chief complaint: Consult for dyspnea, cough  HPI: 53 year old with history of asthma, PE, seizures Previously evaluated in the pulmonary clinic around 2014 for cough which was felt to be upper respiratory.  She is evaluated for allergies with negative RAST panel and negative IgE  Had significant mold exposure in fall 2014.  Since then she has had ongoing issues with cough, shortness of breath, chest tightness, wheezing.  The only thing that is worked in the past is prednisone She is been tried on multiple inhalers.  Currently on Symbicort, Spiriva, Singulair.  Evaluated by Dr. Fredderick Phenix at allergy.  Allergy testing showed sensitivity to only mold  Complains of severe dyspnea, chest tightness, wheezing with cough with daily nighttime awakening..   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  No ongoing exposure.  No mold, hot tub, Jacuzzi Smoking history: 5-pack-year smoker.  Quit in 1990. Travel history: No significant travel history Relevant family history: Mother has asthma, rheumatoid arthritis and lupus. ACQ6 03/21/18- 5.12  Outpatient Encounter Medications as of 03/21/2018  Medication Sig  . ACETAMINOPHEN-BUTALBITAL 50-325 MG TABS Take 2 tablets by mouth daily as needed (headaches).  . ADDERALL XR 30 MG 24 hr capsule 30 mg daily.  Marland Kitchen albuterol (PROVENTIL HFA;VENTOLIN HFA) 108 (90 Base) MCG/ACT inhaler   . albuterol (PROVENTIL) (2.5 MG/3ML) 0.083% nebulizer solution U 3 ML VIA NEB Q 8 H PRN  . DYMISTA 137-50 MCG/ACT SUSP Place 1 puff into the nose 2 (two) times daily.   Marland Kitchen EPINEPHrine 0.3 mg/0.3 mL IJ SOAJ injection Inject 0.3 mLs (0.3 mg total) into the muscle once.  Marland Kitchen FLUoxetine (PROZAC) 20 MG  capsule Take 40 mg by mouth daily.   . montelukast (SINGULAIR) 10 MG tablet Take 10 mg by mouth at bedtime.  Marland Kitchen oxybutynin (DITROPAN-XL) 10 MG 24 hr tablet 10 mg daily.  . Probiotic Product (PROBIOTIC DAILY PO) Take 1 tablet by mouth daily. OTC  . SPIRIVA RESPIMAT 1.25 MCG/ACT AERS 2 puffs daily.  . SYMBICORT 160-4.5 MCG/ACT inhaler 2 puffs 2 (two) times daily.  . [DISCONTINUED] beclomethasone (QVAR) 80 MCG/ACT inhaler Inhale 1 puff into the lungs 2 (two) times daily.  . [DISCONTINUED] fish oil-omega-3 fatty acids 1000 MG capsule Take 2 g by mouth daily.    . [DISCONTINUED] gabapentin (NEURONTIN) 300 MG capsule Take 1 capsule (300 mg total) by mouth 2 (two) times daily.  . [DISCONTINUED] hydrocortisone cream 1 % Apply 1 application topically daily as needed for itching.  . [DISCONTINUED] levocetirizine (XYZAL) 5 MG tablet Take 5 mg by mouth every evening.  . [DISCONTINUED] MAGNESIUM PO Take 1 tablet by mouth daily. 230m  . [DISCONTINUED] MELATONIN PO Take by mouth as needed. 2038m . [DISCONTINUED] Multiple Vitamin (MULTIVITAMIN PO) Take by mouth.    . [DISCONTINUED] ondansetron (ZOFRAN-ODT) 4 MG disintegrating tablet   . [DISCONTINUED] PredniSONE 5 MG KIT 12 day dosepack   No facility-administered encounter medications on file as of 03/21/2018.     Allergies as of 03/21/2018 - Review Complete 03/21/2018  Allergen Reaction Noted  . Depakote [divalproex sodium] Other (See Comments) 02/24/2014  . Topamax [topiramate] Other (See Comments) 02/24/2014  . Tegretol [carbamazepine] Rash 02/24/2014  Past Medical History:  Diagnosis Date  . Anal fissure   . Anxiety   . Asthma    Fall of 2014 [facility])   . Chronic headaches   . PE (pulmonary embolism) 1991   After C-section  . Pneumonia   . PONV (postoperative nausea and vomiting)   . 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 Sinusitis   . Skin cancer    basal cell  . Tuberculosis    tested  positive to exposure; xray clear    Past Surgical History:  Procedure Laterality Date  . BASAL CELL CARCINOMA EXCISION  2008  . basal cell right chest    . BREAST LUMPECTOMY     breast  . [place]  . CLAVICLE SURGERY  2009   plate in right clavicle   . DILITATION & CURRETTAGE/HYSTROSCOPY WITH NOVASURE ABLATION N/A 07/25/2013   Procedure: DILATATION & CURETTAGE/HYSTEROSCOPY WITH NOVASURE ABLATION;  Surgeon: Marylynn Pearson, MD;  Location: [facility];  Service: Gynecology;  Laterality: N/A;  . LEEP N/A 07/25/2013   Procedure: LOOP ELECTROSURGICAL EXCISION PROCEDURE (LEEP);  Surgeon: Marylynn Pearson, MD;  Location: [facility];  Service: Gynecology;  Laterality: N/A;  . TEMPOROMANDIBULAR JOINT ARTHROPLASTY  2002   left     Family History  Problem Relation Age of Onset  . Diabetes Father   . Cancer Father        skin  . Arthritis Father   . Heart disease Father   . Asthma Mother   . Rheum arthritis Mother   . Arthritis Mother   . Heart disease Mother   . Other Mother        hypoglycemia  . Irritable bowel syndrome Daughter   . Cancer Maternal Grandfather        skin  . Melanoma Maternal Grandfather   . Colon cancer Neg Hx   . Esophageal cancer Neg Hx   . Rectal cancer Neg Hx   . Stomach cancer Neg Hx     Social History   Socioeconomic History  . Marital status: Single    Spouse name: Not on file  . Number of children: 2  . Years of education: college  . Highest education level: Not on file  Occupational History  . Occupation: Therapist, sports: [employer]  Social Needs  . Financial resource strain: Not on file  . Food insecurity:    Worry: Not on file    Inability: Not on file  . Transportation needs:    Medical: Not on file    Non-medical: Not on file  Tobacco Use  . Smoking status: Former Smoker    Packs/day: 1.00    Years: 5.00    Pack years: 5.00    Types: Cigarettes    Last attempt to quit: 01/31/1988    Years since  quitting: 30.1  . Smokeless tobacco: Never Used  Substance and Sexual Activity  . Alcohol use: Yes    Alcohol/week: 1.0 - 2.0 standard drinks    Types: 1 - 2 Standard drinks or equivalent per week    Comment: social  . Drug use: No  . Sexual activity: Not on file  Lifestyle  . Physical activity:    Days per week: Not on file    Minutes per session: Not on file  . Stress: Not on file  Relationships  . Social connections:    Talks on phone: Not on file  Gets together: Not on file    Attends religious service: Not on file    Active member of club or organization: Not on file    Attends meetings of clubs or organizations: Not on file    Relationship status: Not on file  . Intimate partner violence:    Fear of current or ex partner: Not on file    Emotionally abused: Not on file    Physically abused: Not on file    Forced sexual activity: Not on file  Other Topics Concern  . Not on file  Social History Narrative   Caffeine 1 cup daily avg.    Review of systems: Review of Systems  Constitutional: Negative for fever and chills.  HENT: Negative.   Eyes: Negative for blurred vision.  Respiratory: as per HPI  Cardiovascular: Negative for chest pain and palpitations.  Gastrointestinal: Negative for vomiting, diarrhea, blood per rectum. Genitourinary: Negative for dysuria, urgency, frequency and hematuria.  Musculoskeletal: Negative for myalgias, back pain and joint pain.  Skin: Negative for itching and rash.  Neurological: Negative for dizziness, tremors, focal weakness, seizures and loss of consciousness.  Endo/Heme/Allergies: Negative for environmental allergies.  Psychiatric/Behavioral: Negative for depression, suicidal ideas and hallucinations.  All other systems reviewed and are negative.  Physical Exam: Blood pressure 132/80, pulse 89, height _0  (1.626 m), weight 148 lb 6.4 oz (67.3 kg), SpO2 97 %. Gen:      No acute distress HEENT:  EOMI, sclera anicteric Neck:      No masses; no thyromegaly Lungs:    Bilateral expiratory wheeze, crackles. CV:         Regular rate and rhythm; no murmurs Abd:      + bowel sounds; soft, non-tender; no palpable masses, no distension Ext:    No edema; adequate peripheral perfusion Skin:      Warm and dry; no rash Neuro: alert and oriented x 3 Psych: normal mood and affect  Data Reviewed: Imaging: Chest x-ray 07/24/2012-no acute cardiopulmonary abnormality.  PFTs:  FENO 03/21/2018-103  Labs:  Assessment:  Severe persistent asthma with acute exacerbation Continue Symbicort, Spiriva, nebulizers, Singulair We will give her a Depo injection in office today and start on a prednisone taper starting at 60 mg.  She will reduce dose by 10 mg every 4 days When she reaches baseline dose of 20 mg we will hold at that until she can be reevaluated Suspect she will require biologic therapy due to persistent, ongoing symptoms  We will check CBC differential, IgE.  Add mold antibody panel and hypersensitivity panel given history of mold exposure.  Also check ANA, rheumatoid factor and CCP given history of autoimmune disease I will schedule pulmonary function test and high-resolution CT for evaluation of the chest.  Get records from [facility]  Plan/Recommendations: - Depo injection, prednisone taper - Continue baseline prednisone of 20 mg/day - Check CBC differential, IgE, ANA, rheumatoid factor, CCP, mold antibody panel, hypersensitive panel, ANCA panel - PFTs, high-res CT  Marshell Garfinkel MD [facility] 03/21/2018, 2:10 PM  CC: [name], MD

## 2018-03-22 LAB — ALLERGY PANEL 11, MOLD GROUP
Allergen, A. alternata, m6: <0.1 kU/L
Allergen, Mucor Racemosus, M4: <0.1 kU/L
Aspergillus fumigatus, m3: <0.1 kU/L
CLADOSPORIUM HERBARUM (M2) IGE: <0.1 kU/L
CLASS: 0
CLASS: 0
Candida Albicans: <0.1 kU/L
Class: 0
Class: 0
Class: 0

## 2018-03-22 LAB — INTERPRETATION:

## 2018-03-25 LAB — IGE: IgE (Immunoglobulin E), Serum: 40 kU/L (ref <=114)

## 2018-03-25 LAB — HYPERSENSITIVITY PNEUMONITIS
A. Pullulans Abs: NEGATIVE
A.Fumigatus #1 Abs: NEGATIVE
Micropolyspora faeni, IgG: NEGATIVE
Pigeon Serum Abs: NEGATIVE
Thermoact. Saccharii: NEGATIVE
Thermoactinomyces vulgaris, IgG: NEGATIVE

## 2018-03-25 LAB — ANTI-NUCLEAR AB-TITER (ANA TITER): ANA Titer 1: 1:320 {titer} — ABNORMAL HIGH

## 2018-03-25 LAB — ANA,IFA RA DIAG PNL W/RFLX TIT/PATN
ANA: POSITIVE — AB
Cyclic Citrullin Peptide Ab: <16 UNITS
Rheumatoid fact SerPl-aCnc: <14 IU/mL (ref <14)

## 2018-03-25 LAB — ANCA SCREEN W REFLEX TITER: ANCA SCREEN: NEGATIVE

## 2018-04-11 ENCOUNTER — Ambulatory Visit (INDEPENDENT_AMBULATORY_CARE_PROVIDER_SITE_OTHER)
Admission: RE | Admit: 2018-04-11 | Discharge: 2018-04-11 | Disposition: A | Discharge status: Home / Self Care | Payer: PRIVATE HEALTH INSURANCE | Source: Ambulatory Visit | Attending: Pulmonary Disease | Admitting: Pulmonary Disease

## 2018-04-11 ENCOUNTER — Other Ambulatory Visit: Payer: Self-pay

## 2018-04-11 DIAGNOSIS — J849 Interstitial pulmonary disease, unspecified: Secondary | ICD-10-CM

## 2018-04-17 NOTE — Telephone Encounter (Signed)
Dr Hart Robinsons, I have been tracking my lab and procedure results. I was relieved regarding the overall negative results for the Chest CT. However, I am a bit concerned regarding the +ANA results in the light of my mother's recent SLE diagnosis. Also, in regards to the elevated Eosinophil results, I wanted to share with you that once I reached the maintenance Predinose dosage of 20 mg qd, my asthma symptoms completely resolved. I have not had to use my resucue inhaler or nebulizer. I have also greatly reduced the usage of my maintenance inhalers with no adverse reactions.  I am scheduled for a PFT and follow-up visit with you next week on 3/26. Due to the current universal health scare, will I be keeping this appt? Also, I will need a refill on the Prednisone Rx if you would like me to continue this current course of treatment.  I'm anxious to learn if my insurance will authorize the injectable medication we discussed. I look forward to hearing from you. Best regards, [name]  Dr Vaughan Browner, please advise

## 2018-04-18 ENCOUNTER — Other Ambulatory Visit: Payer: Self-pay | Admitting: Pulmonary Disease

## 2018-04-18 ENCOUNTER — Telehealth: Payer: Self-pay | Admitting: Pulmonary Disease

## 2018-04-18 DIAGNOSIS — R768 Other specified abnormal immunological findings in serum: Secondary | ICD-10-CM

## 2018-04-18 MED ORDER — PREDNISONE 10 MG PO TABS: ORAL_TABLET | ORAL | 0 refills | Status: DC

## 2018-04-18 NOTE — Telephone Encounter (Signed)
Spoke with the pt She states she already had the results discussed with her and her medication has been sent to the correct pharm  Nothing further needed

## 2018-04-20 ENCOUNTER — Other Ambulatory Visit: Payer: Self-pay

## 2018-04-22 ENCOUNTER — Telehealth: Payer: Self-pay | Admitting: Pulmonary Disease

## 2018-04-22 MED ORDER — PREDNISONE 10 MG PO TABS: ORAL_TABLET | ORAL | 0 refills | Status: DC

## 2018-04-23 MED ORDER — PREDNISONE 20 MG PO TABS: 20.0000 mg | ORAL_TABLET | Freq: Every day | ORAL | 3 refills | Status: DC

## 2018-04-23 NOTE — Telephone Encounter (Signed)
[employer] paperwork faxed.  Faxed application given to Tammy S. To follow up.

## 2018-04-23 NOTE — Telephone Encounter (Signed)
Patient came by office to sign paperwork. Paperwork handed to [place] T to be faxed.

## 2018-04-23 NOTE — Telephone Encounter (Signed)
Yes. We have finished the paperwork from our end and hopefully the injectible biologic medication will be approved soon  We have made a referral to Rheumatology to assess the positive ANA results. We will hold of the appointment on 3/26 for now if your breathing is stable as we are transitioning to only acute visits and tele visits in the office.  I will send in a refill of the prednisone to the pharmacy  Take care   Marshell Garfinkel MD [facility] 04/23/2018, 11:24 AM

## 2018-04-23 NOTE — Telephone Encounter (Signed)
Dup[employer] forms signed by Dr Alexander Bergeron and spoke with Patient.  Patient is to come by office to sign forms.  Forms are placed in envelope at front desk for Patient to sign. Instructions on envelope for front desk staff to return to me, so I can fax, and give to injection.

## 2018-04-23 NOTE — Telephone Encounter (Signed)
Spoke with pt.  Looked for paperwork on 03/23.  Could not find.  Will look again and call pt back.

## 2018-04-25 ENCOUNTER — Telehealth: Payer: Self-pay | Admitting: Pulmonary Disease

## 2018-04-25 ENCOUNTER — Ambulatory Visit: Payer: PRIVATE HEALTH INSURANCE | Admitting: Pulmonary Disease

## 2018-04-25 NOTE — Telephone Encounter (Signed)
Hi Dr. Vaughan Browner, we need you to change pt's dx to one of the dx's [employer] has listed on the last sheet of every enrollment form. They will not accept chronic cough as a dx. Once you've done this we can send the form back and then [employer] will send [place] a summary of benefits. Thank you! Shanika Levings

## 2018-04-26 NOTE — Telephone Encounter (Signed)
Ok. Done 

## 2018-04-30 ENCOUNTER — Telehealth: Payer: Self-pay | Admitting: Pulmonary Disease

## 2018-04-30 NOTE — Telephone Encounter (Signed)
Enrollment forms for Dupixent have been faxed to [employer] [employer]. Will await summary of benefits.

## 2018-05-01 NOTE — Telephone Encounter (Signed)
Received summary of benefits from [employer] [employer]. Called [facility] at [phone]. Started PA over the phone. Insurance is now needing clinical notes to support the need for Dupixent. A form is going to faxed to me to submit these notes. Will await fax.

## 2018-05-01 NOTE — Telephone Encounter (Signed)
Form has been received, filled out and faxed back with pt's clinical notes. Will await PA decision.

## 2018-05-02 NOTE — Telephone Encounter (Signed)
Received a fax from [facility]. Pt's Dupixent has been approved. Approval Dates are 05/01/2018 - 10/31/2018. PA Reference #: [id number].  Will route message to Tammy to order pt's medication.

## 2018-05-06 NOTE — Telephone Encounter (Signed)
Called [facility] To give a verbal script. Aaron Edelman transferred me to Randall Hiss ask me for some pt info., who then transferred me to [place]. Wilfred Lacy took a verbal rx for both the loading dose and the maintenance dose. (6 refills) Wilfred Lacy said if I don't hear from them to call back 2:45ish 05/07/2018.

## 2018-05-07 NOTE — Telephone Encounter (Signed)
Patient states would like to know how often she will be getting the Dupixent injection.  Patient phone number is [phone].

## 2018-05-08 NOTE — Telephone Encounter (Signed)
Called [facility] to order pt's dupixent, rep called pt to get her consent lmom. Pt picked up the fisrt time and then hung up. The rep called back and the call went straight to vm. I'm going to try calling her to see if she recongize's  Our #. I was able to tail to the pt. She wanted to know the frequency

## 2018-05-15 NOTE — Telephone Encounter (Signed)
LMTCB x1 for pt to follow up on this matter.

## 2018-05-16 ENCOUNTER — Other Ambulatory Visit: Payer: Self-pay

## 2018-05-16 ENCOUNTER — Ambulatory Visit (INDEPENDENT_AMBULATORY_CARE_PROVIDER_SITE_OTHER): Payer: PRIVATE HEALTH INSURANCE

## 2018-05-16 DIAGNOSIS — J4551 Severe persistent asthma with (acute) exacerbation: Secondary | ICD-10-CM | POA: Diagnosis not present

## 2018-05-16 MED ORDER — DUPILUMAB 300 MG/2ML ~~LOC~~ SOSY
600.0000 mg | PREFILLED_SYRINGE | Freq: Once | SUBCUTANEOUS | Status: AC
  Administered 2018-05-16: 600 mg via SUBCUTANEOUS

## 2018-05-20 ENCOUNTER — Telehealth: Payer: Self-pay | Admitting: Pulmonary Disease

## 2018-05-20 NOTE — Telephone Encounter (Addendum)
Pt received her first injection from [place], last wk.Marland Kitchen [facility] sent her [facility]ixent to her home. Pt request her med be sent to our office instead. ( I called pharm. To see if the pt had called and asked them to send med to [place], I gave them our address. It should be coming to [place] from now on.) I wasn't going to order [place]. Rep. Asked me when will pt need med.? 05/31/2018. Rep set up del.. Dupixent Order: 300mg  #1 prefilled syringe Ordered Date: 05/20/2018 Expected shipping date from pharmacy: 05/21/2018 Ordered by: [place]: Claria Dice

## 2018-05-20 NOTE — Telephone Encounter (Signed)
Pt has already had her first injection. All of questions were answered when she was here on 05/17/2018.

## 2018-05-21 NOTE — Telephone Encounter (Signed)
Called pt to let her know I called [facility], to let them know pt wants her Dupixent sent to our office. I gave  [facility] our address and the rep confirmed it. I let pt know her Dupixent should come to our office from now on. I also told her the rep asked me when I needed med del.. Pt's appt is 05/31/2018 rep scheduled med for 05/22/2018 delivery. Pt said they told her she had to wait till 05/27/2018 to order med.. Pt asked me to call her if med wasn't here by 05/27/2018. Yes, ma'am. If not pt said she'd call and order Dupixent.  Waiting for Dupixent to come in.

## 2018-05-22 NOTE — Telephone Encounter (Signed)
Dupixent Shipment Received: 300mg  #1 prefilled syringe Medication arrival date: 05/22/2018 Lot #:[id number] Medication exp date: 03/2020 Received by: TBS

## 2018-05-27 ENCOUNTER — Telehealth: Payer: Self-pay | Admitting: Pulmonary Disease

## 2018-05-27 NOTE — Telephone Encounter (Signed)
What antibiotic is she on?

## 2018-05-27 NOTE — Telephone Encounter (Signed)
Primary Pulmonologist: Dr. Vaughan Browner Last office visit and with whom: 03/21/2018 with Dr. Vaughan Browner What do we see them for (pulmonary problems): asthma Last OV assessment/plan: Instructions   We will check some labs today including CBC differential, IgE, ANA, rheumatoid factor, CCP, mold antibody panel, hypersensitivity panel, ANCA panel Schedule you for pulmonary function tests and high-resolution CT of the chest  Continue your inhalers including Symbicort and Spiriva, nebulizers as needed We will give you a Depo injection today and start you on a prednisone taper starting at 60 mg.  Reduce dose by 10 mg every 4 days until you reach dose of 20 mg and hold at that dose until you can be reevaluated.  Follow-up in 4 weeks.      Was appointment offered to patient (explain)?  Pt wants to know recs   Reason for call: called and spoke with pt who stated she recently developed an infection in her elbow and is going to be on abx to help with this and due to this, pt wanted to know if it would be okay for her to be on the prednisone with the abx and I stated to her she should be fine taking the abx with being on the prednisone. Pt expressed understanding. Pt is wanting to know if she is going to be on the prednisone long term. Last OV with Dr. Vaughan Browner stated for pt to be on prednisone until next OV and then they would reevaluate at that time. Pt had first dupixent injection 4/16 and is due to get her second injection 5/1. Pt is not scheduled to have a f/u visit at our office until 6/26 with PFT.  Beth, please advise on this for pt in regards to the prednisone and what you recommend. Thanks!

## 2018-05-27 NOTE — Telephone Encounter (Signed)
Pt taking Bactrim and spoke with Benjamine Mola who stated it was ok and continue taking until next OV.  Nothing further is needed.

## 2018-05-28 ENCOUNTER — Inpatient Hospital Stay (HOSPITAL_BASED_OUTPATIENT_CLINIC_OR_DEPARTMENT_OTHER)
Admission: EM | Admit: 2018-05-28 | Discharge: 2018-05-30 | DRG: 872 | Disposition: A | Discharge status: Home / Self Care | Payer: No Typology Code available for payment source | Attending: Internal Medicine | Admitting: Internal Medicine

## 2018-05-28 ENCOUNTER — Emergency Department (HOSPITAL_BASED_OUTPATIENT_CLINIC_OR_DEPARTMENT_OTHER): Payer: No Typology Code available for payment source

## 2018-05-28 ENCOUNTER — Encounter (HOSPITAL_BASED_OUTPATIENT_CLINIC_OR_DEPARTMENT_OTHER): Payer: Self-pay | Admitting: Adult Health

## 2018-05-28 ENCOUNTER — Other Ambulatory Visit: Payer: Self-pay

## 2018-05-28 DIAGNOSIS — E876 Hypokalemia: Secondary | ICD-10-CM | POA: Diagnosis not present

## 2018-05-28 DIAGNOSIS — Z85828 Personal history of other malignant neoplasm of skin: Secondary | ICD-10-CM

## 2018-05-28 DIAGNOSIS — Z981 Arthrodesis status: Secondary | ICD-10-CM | POA: Diagnosis not present

## 2018-05-28 DIAGNOSIS — M71122 Other infective bursitis, left elbow: Secondary | ICD-10-CM | POA: Diagnosis present

## 2018-05-28 DIAGNOSIS — Z808 Family history of malignant neoplasm of other organs or systems: Secondary | ICD-10-CM

## 2018-05-28 DIAGNOSIS — A419 Sepsis, unspecified organism: Secondary | ICD-10-CM | POA: Diagnosis present

## 2018-05-28 DIAGNOSIS — E871 Hypo-osmolality and hyponatremia: Secondary | ICD-10-CM | POA: Diagnosis present

## 2018-05-28 DIAGNOSIS — Z881 Allergy status to other antibiotic agents status: Secondary | ICD-10-CM | POA: Diagnosis not present

## 2018-05-28 DIAGNOSIS — L03114 Cellulitis of left upper limb: Secondary | ICD-10-CM

## 2018-05-28 DIAGNOSIS — D899 Disorder involving the immune mechanism, unspecified: Secondary | ICD-10-CM | POA: Diagnosis present

## 2018-05-28 DIAGNOSIS — Z7951 Long term (current) use of inhaled steroids: Secondary | ICD-10-CM

## 2018-05-28 DIAGNOSIS — Z87891 Personal history of nicotine dependence: Secondary | ICD-10-CM

## 2018-05-28 DIAGNOSIS — D849 Immunodeficiency, unspecified: Secondary | ICD-10-CM | POA: Diagnosis present

## 2018-05-28 DIAGNOSIS — Z825 Family history of asthma and other chronic lower respiratory diseases: Secondary | ICD-10-CM

## 2018-05-28 DIAGNOSIS — Z888 Allergy status to other drugs, medicaments and biological substances status: Secondary | ICD-10-CM

## 2018-05-28 DIAGNOSIS — Z8249 Family history of ischemic heart disease and other diseases of the circulatory system: Secondary | ICD-10-CM | POA: Diagnosis not present

## 2018-05-28 DIAGNOSIS — Z7952 Long term (current) use of systemic steroids: Secondary | ICD-10-CM

## 2018-05-28 DIAGNOSIS — Z86711 Personal history of pulmonary embolism: Secondary | ICD-10-CM | POA: Diagnosis not present

## 2018-05-28 DIAGNOSIS — Z833 Family history of diabetes mellitus: Secondary | ICD-10-CM | POA: Diagnosis not present

## 2018-05-28 DIAGNOSIS — R768 Other specified abnormal immunological findings in serum: Secondary | ICD-10-CM | POA: Diagnosis present

## 2018-05-28 DIAGNOSIS — Z8261 Family history of arthritis: Secondary | ICD-10-CM

## 2018-05-28 DIAGNOSIS — M7022 Olecranon bursitis, left elbow: Secondary | ICD-10-CM | POA: Diagnosis not present

## 2018-05-28 DIAGNOSIS — J8283 Eosinophilic asthma: Secondary | ICD-10-CM | POA: Diagnosis present

## 2018-05-28 DIAGNOSIS — J82 Pulmonary eosinophilia, not elsewhere classified: Secondary | ICD-10-CM | POA: Diagnosis present

## 2018-05-28 DIAGNOSIS — L0390 Cellulitis, unspecified: Secondary | ICD-10-CM | POA: Diagnosis present

## 2018-05-28 LAB — CBC
HCT: 37.3 % (ref 36.0–46.0)
Hemoglobin: 12.3 g/dL (ref 12.0–15.0)
MCH: 31.8 pg (ref 26.0–34.0)
MCHC: 33 g/dL (ref 30.0–36.0)
MCV: 96.4 fL (ref 80.0–100.0)
Platelets: 207 10*3/uL (ref 150–400)
RBC: 3.87 MIL/uL (ref 3.87–5.11)
RDW: 14 % (ref 11.5–15.5)
WBC: 15.1 10*3/uL — ABNORMAL HIGH (ref 4.0–10.5)
nRBC: 0 % (ref 0.0–0.2)

## 2018-05-28 LAB — CBC WITH DIFFERENTIAL/PLATELET
Abs Immature Granulocytes: 0.16 10*3/uL — ABNORMAL HIGH (ref 0.00–0.07)
Basophils Absolute: 0.1 10*3/uL (ref 0.0–0.1)
Basophils Relative: 0 %
Eosinophils Absolute: 0.3 10*3/uL (ref 0.0–0.5)
Eosinophils Relative: 1 %
HCT: 38.3 % (ref 36.0–46.0)
Hemoglobin: 13 g/dL (ref 12.0–15.0)
Immature Granulocytes: 1 %
Lymphocytes Relative: 6 %
Lymphs Abs: 1.1 10*3/uL (ref 0.7–4.0)
MCH: 32 pg (ref 26.0–34.0)
MCHC: 33.9 g/dL (ref 30.0–36.0)
MCV: 94.3 fL (ref 80.0–100.0)
Monocytes Absolute: 1.1 10*3/uL — ABNORMAL HIGH (ref 0.1–1.0)
Monocytes Relative: 6 %
Neutro Abs: 16.7 10*3/uL — ABNORMAL HIGH (ref 1.7–7.7)
Neutrophils Relative %: 86 %
Platelets: 209 10*3/uL (ref 150–400)
RBC: 4.06 MIL/uL (ref 3.87–5.11)
RDW: 13.9 % (ref 11.5–15.5)
WBC: 19.5 10*3/uL — ABNORMAL HIGH (ref 4.0–10.5)
nRBC: 0 % (ref 0.0–0.2)

## 2018-05-28 LAB — BASIC METABOLIC PANEL
Anion gap: 8 (ref 5–15)
BUN: 14 mg/dL (ref 6–20)
CO2: 24 mmol/L (ref 22–32)
Calcium: 8.6 mg/dL — ABNORMAL LOW (ref 8.9–10.3)
Chloride: 101 mmol/L (ref 98–111)
Creatinine, Ser: 0.58 mg/dL (ref 0.44–1.00)
GFR calc Af Amer: >60 mL/min (ref >60)
GFR calc non Af Amer: >60 mL/min (ref >60)
Glucose, Bld: 100 mg/dL — ABNORMAL HIGH (ref 70–99)
Potassium: 3.5 mmol/L (ref 3.5–5.1)
Sodium: 133 mmol/L — ABNORMAL LOW (ref 135–145)

## 2018-05-28 LAB — CREATININE, SERUM
Creatinine, Ser: 0.5 mg/dL (ref 0.44–1.00)
GFR calc Af Amer: >60 mL/min (ref >60)
GFR calc non Af Amer: >60 mL/min (ref >60)

## 2018-05-28 LAB — C-REACTIVE PROTEIN: CRP: 13.8 mg/dL — ABNORMAL HIGH (ref <1.0)

## 2018-05-28 LAB — SEDIMENTATION RATE: Sed Rate: 15 mm/hr (ref 0–22)

## 2018-05-28 MED ORDER — MORPHINE SULFATE (PF) 4 MG/ML IV SOLN
4.0000 mg | Freq: Once | INTRAVENOUS | Status: AC
  Administered 2018-05-28: 09:00:00 4 mg via INTRAVENOUS
  Filled 2018-05-28: qty 1

## 2018-05-28 MED ORDER — CEFAZOLIN SODIUM-DEXTROSE 1-4 GM/50ML-% IV SOLN
1.0000 g | Freq: Three times a day (TID) | INTRAVENOUS | Status: DC
  Administered 2018-05-28 – 2018-05-30 (×5): 1 g via INTRAVENOUS
  Filled 2018-05-28 (×6): qty 50

## 2018-05-28 MED ORDER — ONDANSETRON HCL 4 MG PO TABS: 4.0000 mg | ORAL_TABLET | Freq: Four times a day (QID) | ORAL | Status: DC | PRN

## 2018-05-28 MED ORDER — OXYBUTYNIN CHLORIDE ER 5 MG PO TB24
10.0000 mg | ORAL_TABLET | Freq: Every day | ORAL | Status: DC
  Administered 2018-05-28 – 2018-05-29 (×2): 10 mg via ORAL
  Filled 2018-05-28 (×3): qty 2

## 2018-05-28 MED ORDER — SODIUM CHLORIDE 0.9% FLUSH
3.0000 mL | Freq: Two times a day (BID) | INTRAVENOUS | Status: DC
  Administered 2018-05-29 (×2): 3 mL via INTRAVENOUS

## 2018-05-28 MED ORDER — ENOXAPARIN SODIUM 40 MG/0.4ML ~~LOC~~ SOLN
40.0000 mg | SUBCUTANEOUS | Status: DC
  Administered 2018-05-28 – 2018-05-29 (×2): 40 mg via SUBCUTANEOUS
  Filled 2018-05-28 (×2): qty 0.4

## 2018-05-28 MED ORDER — SODIUM CHLORIDE 0.9 % IV SOLN
1.0000 g | Freq: Once | INTRAVENOUS | Status: AC
  Administered 2018-05-28: 1 g via INTRAVENOUS
  Filled 2018-05-28: qty 10

## 2018-05-28 MED ORDER — MORPHINE SULFATE (PF) 4 MG/ML IV SOLN
4.0000 mg | Freq: Once | INTRAVENOUS | Status: AC
  Administered 2018-05-28: 4 mg via INTRAVENOUS
  Filled 2018-05-28: qty 1

## 2018-05-28 MED ORDER — ONDANSETRON HCL 4 MG/2ML IJ SOLN
4.0000 mg | Freq: Once | INTRAMUSCULAR | Status: AC
  Administered 2018-05-28: 09:00:00 4 mg via INTRAVENOUS
  Filled 2018-05-28: qty 2

## 2018-05-28 MED ORDER — MAGNESIUM HYDROXIDE 400 MG/5ML PO SUSP: 30.0000 mL | Freq: Every day | ORAL | Status: DC | PRN

## 2018-05-28 MED ORDER — FLEET ENEMA 7-19 GM/118ML RE ENEM: 1.0000 | ENEMA | Freq: Once | RECTAL | Status: DC | PRN

## 2018-05-28 MED ORDER — IBUPROFEN 200 MG PO TABS: 200.0000 mg | ORAL_TABLET | Freq: Four times a day (QID) | ORAL | Status: DC | PRN

## 2018-05-28 MED ORDER — PREDNISONE 10 MG PO TABS
10.0000 mg | ORAL_TABLET | Freq: Every day | ORAL | Status: DC
  Administered 2018-05-29 – 2018-05-30 (×2): 10 mg via ORAL
  Filled 2018-05-28 (×2): qty 1

## 2018-05-28 MED ORDER — SORBITOL 70 % SOLN: 30.0000 mL | Freq: Every day | Status: DC | PRN

## 2018-05-28 MED ORDER — SULFAMETHOXAZOLE-TRIMETHOPRIM 800-160 MG PO TABS
1.0000 | ORAL_TABLET | Freq: Once | ORAL | Status: AC
  Administered 2018-05-28: 10:00:00 1 via ORAL
  Filled 2018-05-28: qty 1

## 2018-05-28 MED ORDER — MORPHINE SULFATE (PF) 4 MG/ML IV SOLN
4.0000 mg | Freq: Once | INTRAVENOUS | Status: AC
  Administered 2018-05-28: 11:00:00 4 mg via INTRAVENOUS
  Filled 2018-05-28: qty 1

## 2018-05-28 MED ORDER — ACETAMINOPHEN 650 MG RE SUPP: 650.0000 mg | Freq: Four times a day (QID) | RECTAL | Status: DC | PRN

## 2018-05-28 MED ORDER — ONDANSETRON HCL 4 MG/2ML IJ SOLN: 4.0000 mg | Freq: Four times a day (QID) | INTRAMUSCULAR | Status: DC | PRN

## 2018-05-28 MED ORDER — METOPROLOL SUCCINATE ER 25 MG PO TB24
25.0000 mg | ORAL_TABLET | Freq: Every day | ORAL | Status: DC
  Administered 2018-05-28 – 2018-05-30 (×3): 25 mg via ORAL
  Filled 2018-05-28 (×3): qty 1

## 2018-05-28 MED ORDER — MORPHINE SULFATE (PF) 2 MG/ML IV SOLN
2.0000 mg | INTRAVENOUS | Status: DC | PRN
  Administered 2018-05-28 – 2018-05-29 (×5): 2 mg via INTRAVENOUS
  Filled 2018-05-28 (×5): qty 1

## 2018-05-28 MED ORDER — ACETAMINOPHEN 325 MG PO TABS
650.0000 mg | ORAL_TABLET | Freq: Four times a day (QID) | ORAL | Status: DC | PRN
  Administered 2018-05-29: 650 mg via ORAL
  Filled 2018-05-28: qty 2

## 2018-05-28 MED ORDER — DIPHENHYDRAMINE HCL 50 MG/ML IJ SOLN
25.0000 mg | Freq: Once | INTRAMUSCULAR | Status: AC
  Administered 2018-05-28: 23:00:00 25 mg via INTRAVENOUS
  Filled 2018-05-28: qty 1

## 2018-05-28 MED ORDER — MONTELUKAST SODIUM 10 MG PO TABS
10.0000 mg | ORAL_TABLET | Freq: Every day | ORAL | Status: DC
  Administered 2018-05-28 – 2018-05-29 (×2): 10 mg via ORAL
  Filled 2018-05-28 (×2): qty 1

## 2018-05-28 MED ORDER — SENNA 8.6 MG PO TABS
1.0000 | ORAL_TABLET | Freq: Two times a day (BID) | ORAL | Status: DC
  Administered 2018-05-28 – 2018-05-30 (×4): 8.6 mg via ORAL
  Filled 2018-05-28 (×4): qty 1

## 2018-05-28 MED ORDER — SODIUM CHLORIDE 0.9 % IV SOLN
INTRAVENOUS | Status: DC
  Administered 2018-05-28 – 2018-05-30 (×3): via INTRAVENOUS

## 2018-05-28 MED ORDER — FLUOXETINE HCL 20 MG PO CAPS
40.0000 mg | ORAL_CAPSULE | Freq: Every day | ORAL | Status: DC
  Administered 2018-05-28 – 2018-05-30 (×3): 40 mg via ORAL
  Filled 2018-05-28 (×3): qty 2

## 2018-05-28 NOTE — ED Provider Notes (Signed)
[facility] EMERGENCY DEPARTMENT Provider Note   CSN: [id number] Arrival date & time: 05/28/18  6387    History   Chief Complaint Chief Complaint  Patient presents with  . Joint Swelling    HPI Tammie Sanders is a 53 y.o. female.     53yo F w/ PMH including asthma on chronic steroids, PE, anxiety who p/w L elbow swelling. 2 days ago, she began having L elbow pain and redness around her olecranon bursa. Sx have worsened and she had an E-visit w/ PCP who was concerned about infected bursa and sent her here for evaluation. She reports swelling has worsened and spread to proximal forearm. She is unable to fully extend elbow. Denies any recent trauma to elbow or other joint problems. She felt feverish this morning but did not measure her temperature.  She took Aleve prior to arrival.  She reports the only therapy that has helped is soaking her arm in an ice bath.  She denies any new cough/cold symptoms or rash.  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  She reports that she is ANA positive but denies h/o joint problems.  The history is provided by the patient.    Past Medical History:  Diagnosis Date  . Anal fissure   . Anxiety   . Asthma    Fall of 2014 [facility])   . Chronic headaches   . PE (pulmonary embolism) 1991   After C-section  . Pneumonia   . PONV (postoperative nausea and vomiting)   . Seizures ([place])    per pt= "pseudo seizures from stress" last occurence 2001  . Sinusitis   . Skin cancer    basal cell  . Tuberculosis    tested positive to exposure; xray clear    Patient Active Problem List   Diagnosis Date Noted  . Partial symptomatic epilepsy with complex partial seizures, not intractable, without status epilepticus ([place]) 02/24/2014  . Nocturnal headaches 02/24/2014  . Chronic cough 07/12/2012  . Breast mass in female 11/01/2010    Past Surgical History:  Procedure Laterality Date  . BASAL  CELL CARCINOMA EXCISION  2008  . basal cell right chest    . BREAST LUMPECTOMY     breast  . [place]  . CLAVICLE SURGERY  2009   plate in right clavicle   . DILITATION & CURRETTAGE/HYSTROSCOPY WITH NOVASURE ABLATION N/A 07/25/2013   Procedure: DILATATION & CURETTAGE/HYSTEROSCOPY WITH NOVASURE ABLATION;  Surgeon: Marylynn Pearson, MD;  Location: [facility];  Service: Gynecology;  Laterality: N/A;  . LEEP N/A 07/25/2013   Procedure: LOOP ELECTROSURGICAL EXCISION PROCEDURE (LEEP);  Surgeon: Marylynn Pearson, MD;  Location: [facility];  Service: Gynecology;  Laterality: N/A;  . TEMPOROMANDIBULAR JOINT ARTHROPLASTY  2002   left      OB History   No obstetric history on file.      Home Medications    Prior to Admission medications   Medication Sig Start Date End Date Taking? Authorizing Provider  Dupilumab (DUPIXENT [place]) Inject into the skin.   Yes [provider]  lactulose (CHRONULAC) 10 GM/15ML solution Take by mouth 3 (three) times daily.   Yes [provider]  ACETAMINOPHEN-BUTALBITAL 50-325 MG TABS Take 2 tablets by mouth daily as needed (headaches).    [provider]  ADDERALL XR 30 MG 24 hr capsule 30 mg daily. 02/07/18   [provider]  albuterol (PROVENTIL HFA;VENTOLIN HFA) 108 (90 Base)  MCG/ACT inhaler  03/04/18   [provider]  albuterol (PROVENTIL) (2.5 MG/3ML) 0.083% nebulizer solution U 3 ML VIA NEB Q 8 H PRN 12/03/17   [provider]  DYMISTA 137-50 MCG/ACT SUSP Place 1 puff into the nose 2 (two) times daily.  02/14/14   [provider]  EPINEPHrine 0.3 mg/0.3 mL IJ SOAJ injection Inject 0.3 mLs (0.3 mg total) into the muscle once. 04/04/14   Gregor Hams, MD  FLUoxetine (PROZAC) 20 MG capsule Take 40 mg by mouth daily.  10/20/10   [provider]  montelukast (SINGULAIR) 10 MG tablet Take 10 mg by mouth at bedtime.    [provider]  oxybutynin (DITROPAN-XL) 10 MG 24 hr tablet 10 mg  daily. 03/03/18   [provider]  predniSONE (DELTASONE) 10 MG tablet 2tabs (20mg ) daily 04/22/18   Mannam, Praveen, MD  predniSONE (DELTASONE) 20 MG tablet Take 1 tablet (20 mg total) by mouth daily with breakfast. 04/23/18   Mannam, Praveen, MD  Probiotic Product (PROBIOTIC DAILY PO) Take 1 tablet by mouth daily. OTC    [provider]  SPIRIVA RESPIMAT 1.25 MCG/ACT AERS 2 puffs daily. 01/31/18   [provider]  SYMBICORT 160-4.5 MCG/ACT inhaler 2 puffs 2 (two) times daily. 12/11/17   [provider]    Family History Family History  Problem Relation Age of Onset  . Diabetes Father   . Cancer Father        skin  . Arthritis Father   . Heart disease Father   . Asthma Mother   . Rheum arthritis Mother   . Arthritis Mother   . Heart disease Mother   . Other Mother        hypoglycemia  . Irritable bowel syndrome Daughter   . Cancer Maternal Grandfather        skin  . Melanoma Maternal Grandfather   . Colon cancer Neg Hx   . Esophageal cancer Neg Hx   . Rectal cancer Neg Hx   . Stomach cancer Neg Hx     Social History Social History   Tobacco Use  . Smoking status: Former Smoker    Packs/day: 1.00    Years: 5.00    Pack years: 5.00    Types: Cigarettes    Last attempt to quit: 01/31/1988    Years since quitting: 30.3  . Smokeless tobacco: Never Used  Substance Use Topics  . Alcohol use: Yes    Alcohol/week: 1.0 - 2.0 standard drinks    Types: 1 - 2 Standard drinks or equivalent per week    Comment: social  . Drug use: No     Allergies   Clindamycin/lincomycin; Depakote [divalproex sodium]; Levaquin [levofloxacin]; Other; Topamax [topiramate]; and Tegretol [carbamazepine]   Review of Systems Review of Systems All other systems reviewed and are negative except that which was mentioned in HPI   Physical Exam Updated Vital Signs BP (!) 158/97 (BP Location: Right Arm)   Pulse 99   Temp 99.1 F (37.3 C) (Oral)   Resp 20   Ht 5'  4" (1.626 m)   Wt 63.5 kg   SpO2 99%   BMI 24.03 kg/m   Physical Exam Vitals signs and nursing note reviewed.  Constitutional:      General: She is not in acute distress.    Appearance: She is well-developed.     Comments: Uncomfortable 2/2 pain  HENT:     Head: Normocephalic and atraumatic.     Nose:  Nose normal.     Mouth/Throat:     Mouth: Mucous membranes are moist.     Pharynx: Oropharynx is clear.  Eyes:     Conjunctiva/sclera: Conjunctivae normal.  Neck:     Musculoskeletal: Neck supple.  Cardiovascular:     Rate and Rhythm: Normal rate and regular rhythm.     Pulses: Normal pulses.  Pulmonary:     Effort: Pulmonary effort is normal.  Abdominal:     General: There is no distension.     Palpations: Abdomen is soft.     Tenderness: There is no abdominal tenderness.  Musculoskeletal:        General: Swelling and tenderness present.     Comments: L elbow held in partial flexion; erythema, swelling and warmth on dorsal elbow extending onto proximal forearm w/ associated tenderness  Skin:    General: Skin is warm and dry.     Capillary Refill: Capillary refill takes less than 2 seconds.     Findings: Erythema present.  Neurological:     Mental Status: She is alert and oriented to person, place, and time.     Sensory: No sensory deficit.     Comments: Fluent speech Normal grip strength b/l  Psychiatric:        Judgment: Judgment normal.        ED Treatments / Results  Labs (all labs ordered are listed, but only abnormal results are displayed) Labs Reviewed  BASIC METABOLIC PANEL - Abnormal; Notable for the following components:      Result Value   Sodium 133 (*)    Glucose, Bld 100 (*)    Calcium 8.6 (*)    All other components within normal limits  CBC WITH DIFFERENTIAL/PLATELET - Abnormal; Notable for the following components:   WBC 19.5 (*)    Neutro Abs 16.7 (*)    Monocytes Absolute 1.1 (*)    Abs Immature Granulocytes 0.16 (*)    All other  components within normal limits  CULTURE, BLOOD (ROUTINE X 2)  CULTURE, BLOOD (ROUTINE X 2)  SEDIMENTATION RATE  C-REACTIVE PROTEIN    EKG None  Radiology Dg Elbow Complete Left  Result Date: 05/28/2018 CLINICAL DATA:  Two days left elbow pain and swelling with redness. EXAM: LEFT ELBOW - COMPLETE 3+ VIEW COMPARISON:  None. FINDINGS: Minimal soft tissue swelling over the extensor surface of the elbow. No evidence of fracture or dislocation. No significant joint effusion. IMPRESSION: No acute fracture. Mild soft tissue swelling over the extensor surface of the elbow. Electronically Signed   By: Marin Olp M.D.   On: 05/28/2018 09:18    Procedures Procedures (including critical care time)  Medications Ordered in ED Medications  cefTRIAXone (ROCEPHIN) 1 g in sodium chloride 0.9 % 100 mL IVPB (has no administration in time range)  morphine 4 MG/ML injection 4 mg (4 mg Intravenous Given 05/28/18 0856)  ondansetron (ZOFRAN) injection 4 mg (4 mg Intravenous Given 05/28/18 0855)  sulfamethoxazole-trimethoprim (BACTRIM DS) 800-160 MG per tablet 1 tablet (1 tablet Oral Given 05/28/18 1003)     Initial Impression / Assessment and Plan / ED Course  I have reviewed the triage vital signs and the nursing notes.  Pertinent labs & imaging results that were available during my care of the patient were reviewed by me and considered in my medical decision making (see chart for details).       T 99.1 on arrival. Concern for septic bursitis vs septic joint vs cellulitis, she has underlying immunosuppression  raising concern for bacterial process. XR shows no joint effusion. Labs notable for WBC 19.5.  I have added blood cultures.  I discussed with orthopedics on-call, Dr. Veverly Fells, who recommended patient for IV antibiotics given the patient's underlying immunosuppression, rapidly progressing symptoms, and leukocytosis.  Ordered Keflex and Bactrim.  He has also recommended a long-arm splint for  immobilization.  [facility] admission with [facility] hospitalist [facility], Dr. Pietro Cassis, and pt will be transferred for further care.  Final Clinical Impressions(s) / ED Diagnoses   Final diagnoses:  Septic bursitis of elbow, left    ED Discharge Orders    None       Jyrah Blye, Wenda Overland, MD 05/28/18 1007

## 2018-05-28 NOTE — H&P (Addendum)
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 Veverly Fells.  Normal range of movement of elbow joint.  No evidence of septic arthritis. -As recommended, IV Ancef has been started. -Immobilizing with long-arm padded splint as tolerated by the patient -Keep the arm elevated.  Eosinophilic asthma -Continue prednisone daily. Also on subcu dupilumab, got the first dose of 10 days ago.  -To follow-up as an outpatient to decide when she will be eligible for the next month.  Mobility: Encourage ambulation Diet: Regular diet DVT prophylaxis:  Lovenox Code Status:  Full code Disposition Plan:  Probably home in next 2 to 3 days.  ----------------------------------------------------------------------------------------------------- History of Present Illness: Tammie Sanders is a 53 y.o. female with history of eosinophilic asthma recently started on a monoclonal antibody by the name of dupilumab. It's a subcutaneous injection given every 2 weeks. She had the first dose of that 10 days ago.  Patient presented to [facility] today with complaint of left elbow pain, redness and swelling.  It started and rapidly progressed in last 48 hours.  No fever.  No other joint involvement. In the ED, temperature 99.8, heart rate in the 90s, blood pressure mostly in normal range, breathing comfortably on room air Labs showed WBC count elevated to 19.5, normal hemoglobin and platelets, sodium level slightly low at 133,  renal function normal, CRP elevated to 13.8.  Blood culture was sent. Left elbow x-ray showed no acute fracture and mild soft tissue swelling over the extensor surface of the elbow. ED physician discussed the case with orthopedics on-call Dr. Veverly Fells and is recommended, patient was started on IV antibiotics and admitted to [facility]  She also had a compression bandage over the swelling. At the time of my evaluation, patient was complaining of severe pain because of the compression bandage.  I removed it.  Continues to have redness, tenderness and swelling over the left elbow joint area.  No open wound.  There is local tenderness but no pain on actual movement of the elbow.  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   Sinusitis    Skin cancer    basal cell   Tuberculosis    tested positive to exposure; xray clear    Past surgical history: Past Surgical History:  Procedure Laterality Date   BASAL CELL CARCINOMA EXCISION  2008   basal cell right chest     BREAST LUMPECTOMY     breast   [place]  2009   plate in right clavicle    [place] N/A 07/25/2013   Procedure: DILATATION & CURETTAGE/HYSTEROSCOPY WITH NOVASURE ABLATION;  Surgeon:  Marylynn Pearson, MD;  Location: [facility];  Service: Gynecology;  Laterality: N/A;   LEEP N/A 07/25/2013   Procedure: LOOP ELECTROSURGICAL EXCISION PROCEDURE (LEEP);  Surgeon: Marylynn Pearson, MD;  Location: [facility];  Service: Gynecology;  Laterality:  N/A;   TEMPOROMANDIBULAR JOINT ARTHROPLASTY  2002   left     Social History:  reports that she quit smoking about 30 years ago. Her smoking use included cigarettes. She has a 5.00 pack-year smoking history. She has never used smokeless tobacco. She reports current alcohol use of about 1.0 - 2.0 standard drinks of alcohol per week. She reports that she does not use drugs.  Allergies:  Allergies  Allergen Reactions   Mold Extract [Trichophyton] Shortness Of Breath   Clindamycin/Lincomycin    Depakote [Divalproex Sodium] Other (See Comments)    Weight gain   Levaquin [Levofloxacin]    Other    Topamax [Topiramate] Other (See Comments)    Muscle pain   Tegretol [Carbamazepine] Rash    Family history:  Family History  Problem Relation Age of Onset   Diabetes Father    Cancer Father        skin   Arthritis Father    Heart disease Father    Asthma Mother    Rheum arthritis Mother    Arthritis Mother    Heart disease Mother    Other Mother        hypoglycemia   Irritable bowel syndrome Daughter    Cancer Maternal Grandfather        skin   Melanoma Maternal Grandfather    Colon cancer Neg Hx    Esophageal cancer Neg Hx    Rectal cancer Neg Hx    Stomach cancer Neg Hx      Home Meds: Prior to Admission medications   Medication Sig Start Date End Date Taking? Authorizing Provider  albuterol (PROVENTIL HFA;VENTOLIN HFA) 108 (90 Base) MCG/ACT inhaler Inhale 2 puffs into the lungs every 6 (six) hours as needed for wheezing or shortness of breath.  03/04/18  Yes [provider]  albuterol (PROVENTIL) (2.5 MG/3ML) 0.083% nebulizer solution Take 2.5 mg by nebulization every 4 (four) hours as needed for wheezing or shortness of breath.  12/03/17  Yes [provider]  Dupilumab ([place]) Inject into the skin.   Yes [provider]  DYMISTA 137-50 MCG/ACT SUSP Place 1 puff into the nose 2 (two) times daily as needed (congestion).   02/14/14  Yes [provider]  EPINEPHrine 0.3 mg/0.3 mL IJ SOAJ injection Inject 0.3 mLs (0.3 mg total) into the muscle once. 04/04/14  Yes Gregor Hams, MD  FLUoxetine (PROZAC) 40 MG capsule Take 40 mg by mouth daily. 05/04/18  Yes [provider]  ibuprofen (ADVIL) 200 MG tablet Take 200 mg by mouth every 6 (six) hours as needed for headache or mild pain.   Yes [provider]  metoprolol succinate (TOPROL-XL) 25 MG 24 hr tablet Take 25 mg by mouth daily.   Yes [provider]  montelukast (SINGULAIR) 10 MG tablet Take 10 mg by mouth at bedtime.   Yes [provider]  oxybutynin (DITROPAN-XL) 10 MG 24 hr tablet Take 10 mg by mouth daily.  03/03/18  Yes [provider]  predniSONE (DELTASONE) 10 MG tablet 2tabs (20mg ) daily Patient taking differently: Take 10 mg by mouth daily with breakfast.  04/22/18  Yes Mannam, Praveen, MD  SPIRIVA RESPIMAT 1.25 MCG/ACT AERS Inhale 2 puffs into the lungs daily.  01/31/18  Yes [provider]  SYMBICORT 160-4.5 MCG/ACT inhaler Inhale 2 puffs into the lungs 2 (two) times daily.  12/11/17  Yes [provider]  predniSONE (DELTASONE) 20 MG tablet Take 1 tablet (20 mg total) by mouth daily with breakfast. Patient not taking: Reported on 05/28/2018 04/23/18   Marshell Garfinkel, MD    Physical Exam: Vitals:   05/28/18 0826 05/28/18 1038 05/28/18 1310 05/28/18 1433  BP:  120/71 104/72 111/90  Pulse:  95 96 99  Resp:  18 18 16   Temp:   98.9 F (37.2 C) 99.8 F (37.7 C)  TempSrc:   Oral Oral  SpO2:  96% 97% 100%  Weight: 63.5 kg     Height: 5\' 4"  (1.626 m)      Wt Readings from Last 3 Encounters:  05/28/18 63.5 kg  03/21/18 67.3 kg  04/04/14 59.9 kg   Body mass index is 24.03 kg/m.  General exam: Pleasant middle-aged Caucasian female.  In distress from pain in left elbow Skin: No rashes, lesions or ulcers. HEENT: Normal Lungs: Clear to auscultation bilaterally CVS: Regular rate and  rhythm, no murmur GI/Abd soft, nondistended, nontender, bowel sound present CNS: Alert, awake monitor x3 Psychiatry: Mood & affect appropriate.  Extremities: Left elbow swollen, red and tender.  No open wound.  Labs on Admission:   CBC: Recent Labs  Lab 05/28/18 0850 05/28/18 1446  WBC 19.5* 15.1*  NEUTROABS 16.7*  --   HGB 13.0 12.3  HCT 38.3 37.3  MCV 94.3 96.4  PLT 209 865    Basic Metabolic Panel: Recent Labs  Lab 05/28/18 0850 05/28/18 1446  NA 133*  --   K 3.5  --   CL 101  --   CO2 24  --   GLUCOSE 100*  --   BUN 14  --   CREATININE 0.58 0.50  CALCIUM 8.6*  --     Liver Function Tests: No results for input(s): AST, ALT, ALKPHOS, BILITOT, PROT, ALBUMIN in the last 168 hours. No results for input(s): LIPASE, AMYLASE in the last 168 hours. No results for input(s): AMMONIA in the last 168 hours.  Cardiac Enzymes: No results for input(s): CKTOTAL, CKMB, CKMBINDEX, TROPONINI in the last 168 hours.  BNP (last 3 results) No results for input(s): BNP in the last 8760 hours.  ProBNP (last 3 results) No results for input(s): PROBNP in the last 8760 hours.  CBG: No results for input(s): GLUCAP in the last 168 hours.  Lipase  No results found for: LIPASE   Urinalysis    Component Value Date/Time   COLORURINE YELLOW 12/16/2009 [place] 12/16/2009 0937   LABSPEC 1.015 12/16/2009 0937   PHURINE 6.0 12/16/2009 0937   GLUCOSEU NEGATIVE 12/16/2009 0937   HGBUR LARGE (A) 12/16/2009 0937   BILIRUBINUR NEGATIVE 12/16/2009 [place] 12/16/2009 0937   PROTEINUR NEGATIVE 12/16/2009 0937   UROBILINOGEN 0.2 12/16/2009 0937   NITRITE NEGATIVE 12/16/2009 0937   LEUKOCYTESUR NEGATIVE 12/16/2009 7846     Drugs of Abuse  No results found for: LABOPIA, COCAINSCRNUR, LABBENZ, AMPHETMU, THCU, LABBARB    Radiological Exams on Admission: Dg Elbow Complete Left  Result Date: 05/28/2018 CLINICAL DATA:  Two days left elbow pain and  swelling with redness. EXAM: LEFT ELBOW - COMPLETE 3+ VIEW COMPARISON:  None. FINDINGS: Minimal soft tissue swelling over the extensor surface of the elbow. No evidence of fracture or dislocation. No significant joint effusion. IMPRESSION: No acute fracture. Mild soft tissue swelling over  the extensor surface of the elbow. Electronically Signed   By: Marin Olp M.D.   On: 05/28/2018 09:18   ----------------------------------------------------------------------------------------------------------------------------------------------------------- Severity of Illness: The appropriate patient status for this patient is INPATIENT. Inpatient status is judged to be reasonable and necessary in order to provide the required intensity of service to ensure the patient's safety. The patient's presenting symptoms, physical exam findings, and initial radiographic and laboratory data in the context of their chronic comorbidities is felt to place them at high risk for further clinical deterioration. Furthermore, it is not anticipated that the patient will be medically stable for discharge from the hospital within 2 midnights of admission. The following factors support the patient status of inpatient.   " The patient's presenting symptoms include red, swollen, tender left elbow. " The worrisome physical exam findings include red, swollen, tender lateral. " The initial radiographic and laboratory data are worrisome because of soft tissue swelling x-ray. " The chronic co-morbidities include eosinophilic asthma on immunosuppression.  * I certify that at the point of admission it is my clinical judgment that the patient will require inpatient hospital care spanning beyond 2 midnights from the point of admission due to high intensity of service, high risk for further deterioration and high frequency of surveillance required.*   Signed, Terrilee Croak, MD [facility] 05/28/2018

## 2018-05-28 NOTE — ED Triage Notes (Signed)
Presents with 2 days of left elbow erythema, pain and edema. She had an evisit with her MD yesterday and was placed on Bactrim. She took 1 bactrim last night, today woke with worsening pain and spreading of the redness and edema to her mid forearm. She recently started an injection medication 10 days ago-it is a biol;ogic medication for asthma. She is also taking prednisone daily 10mg . She reports feeling feverish this am. Denies nausea, vomiting and diarrhea. She is unable to fully extend her elbow due to pain.

## 2018-05-28 NOTE — Consult Note (Signed)
Reason for Consult:Left elbow pain Referring Physician: Heyli Min is an 53 y.o. female.  HPI: 53 yo female with a history of Eosinophilic Asthma who is on chronic steroids presents with a history of increasing left elbow pain and some redness and swelling.  She reports no trauma to the elbow. She feels warm.  Patient presented to [facility] and was transferred to [facility] for definitive treatment for a suspected septic olecranon bursitis. She denies any other complaints.  Past Medical History:  Diagnosis Date  . Anal fissure   . Anxiety   . Asthma    Fall of 2014 [facility])   . Chronic headaches   . PE (pulmonary embolism) 1991   After C-section  . Pneumonia   . PONV (postoperative nausea and vomiting)   . 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 Sinusitis   . Skin cancer    basal cell  . Tuberculosis    tested positive to exposure; xray clear    Past Surgical History:  Procedure Laterality Date  . BASAL CELL CARCINOMA EXCISION  2008  . basal cell right chest    . BREAST LUMPECTOMY     breast  . [place]  . CLAVICLE SURGERY  2009   plate in right clavicle   . DILITATION & CURRETTAGE/HYSTROSCOPY WITH NOVASURE ABLATION N/A 07/25/2013   Procedure: DILATATION & CURETTAGE/HYSTEROSCOPY WITH NOVASURE ABLATION;  Surgeon: Marylynn Pearson, MD;  Location: [facility];  Service: Gynecology;  Laterality: N/A;  . LEEP N/A 07/25/2013   Procedure: LOOP ELECTROSURGICAL EXCISION PROCEDURE (LEEP);  Surgeon: Marylynn Pearson, MD;  Location: [facility];  Service: Gynecology;  Laterality: N/A;  . TEMPOROMANDIBULAR JOINT ARTHROPLASTY  2002   left     Family History  Problem Relation Age of Onset  . Diabetes Father   . Cancer Father        skin  . Arthritis Father   . Heart disease Father   . Asthma Mother   . Rheum arthritis Mother   . Arthritis Mother   . Heart disease Mother   . Other Mother    hypoglycemia  . Irritable bowel syndrome Daughter   . Cancer Maternal Grandfather        skin  . Melanoma Maternal Grandfather   . Colon cancer Neg Hx   . Esophageal cancer Neg Hx   . Rectal cancer Neg Hx   . Stomach cancer Neg Hx     Social History:  reports that she quit smoking about 30 years ago. Her smoking use included cigarettes. She has a 5.00 pack-year smoking history. She has never used smokeless tobacco. She reports current alcohol use of about 1.0 - 2.0 standard drinks of alcohol per week. She reports that she does not use drugs.  Allergies:  Allergies  Allergen Reactions  . Mold Extract [Trichophyton] Shortness Of Breath  . Clindamycin/Lincomycin   . Depakote [Divalproex Sodium] Other (See Comments)    Weight gain  . Levaquin [Levofloxacin]   . Other   . Topamax [Topiramate] Other (See Comments)    Muscle pain  . Tegretol [Carbamazepine] Rash    Medications: I have reviewed the patient's current medications.  Results for orders placed or performed during the hospital encounter of 05/28/18 (from the past 48 hour(s))  Basic metabolic panel     Status: Abnormal   Collection Time: 05/28/18  8:50 AM  Result Value Ref Range  Sodium 133 (L) 135 - 145 mmol/L   Potassium 3.5 3.5 - 5.1 mmol/L   Chloride 101 98 - 111 mmol/L   CO2 24 22 - 32 mmol/L   Glucose, Bld 100 (H) 70 - 99 mg/dL   BUN 14 6 - 20 mg/dL   Creatinine, Ser 0.58 0.44 - 1.00 mg/dL   Calcium 8.6 (L) 8.9 - 10.3 mg/dL   GFR calc non Af Amer >60 >60 mL/min   GFR calc Af Amer >60 >60 mL/min   Anion gap 8 5 - 15    Comment: Performed at [facility], [address] [place], [place] [address]  CBC with Differential     Status: Abnormal   Collection Time: 05/28/18  8:50 AM  Result Value Ref Range   WBC 19.5 (H) 4.0 - 10.5 K/uL   RBC 4.06 3.87 - 5.11 MIL/uL   Hemoglobin 13.0 12.0 - 15.0 g/dL   HCT 38.3 36.0 - 46.0 %   MCV 94.3 80.0 - 100.0 fL   MCH 32.0 26.0 - 34.0 pg   MCHC 33.9 30.0 - 36.0  g/dL   RDW 13.9 11.5 - 15.5 %   Platelets 209 150 - 400 K/uL   nRBC 0.0 0.0 - 0.2 %   Neutrophils Relative % 86 %   Neutro Abs 16.7 (H) 1.7 - 7.7 K/uL   Lymphocytes Relative 6 %   Lymphs Abs 1.1 0.7 - 4.0 K/uL   Monocytes Relative 6 %   Monocytes Absolute 1.1 (H) 0.1 - 1.0 K/uL   Eosinophils Relative 1 %   Eosinophils Absolute 0.3 0.0 - 0.5 K/uL   Basophils Relative 0 %   Basophils Absolute 0.1 0.0 - 0.1 K/uL   Immature Granulocytes 1 %   Abs Immature Granulocytes 0.16 (H) 0.00 - 0.07 K/uL    Comment: Performed at [facility], [address] [place], [place] [address]  Sedimentation rate     Status: None   Collection Time: 05/28/18  8:50 AM  Result Value Ref Range   Sed Rate 15 0 - 22 mm/hr    Comment: Performed at [facility], [address] [place], [place] [address]  C-reactive protein     Status: Abnormal   Collection Time: 05/28/18  8:50 AM  Result Value Ref Range   CRP 13.8 (H) <1.0 mg/dL    Comment: Performed at [facility], [address] [place], [place] [address]  CBC     Status: Abnormal   Collection Time: 05/28/18  2:46 PM  Result Value Ref Range   WBC 15.1 (H) 4.0 - 10.5 K/uL   RBC 3.87 3.87 - 5.11 MIL/uL   Hemoglobin 12.3 12.0 - 15.0 g/dL   HCT 37.3 36.0 - 46.0 %   MCV 96.4 80.0 - 100.0 fL   MCH 31.8 26.0 - 34.0 pg   MCHC 33.0 30.0 - 36.0 g/dL   RDW 14.0 11.5 - 15.5 %   Platelets 207 150 - 400 K/uL   nRBC 0.0 0.0 - 0.2 %    Comment: Performed at [facility], [place] [address] [place], [place] [address]    Dg Elbow Complete Left  Result Date: 05/28/2018 CLINICAL DATA:  Two days left elbow pain and swelling with redness. EXAM: LEFT ELBOW - COMPLETE 3+ VIEW COMPARISON:  None. FINDINGS: Minimal soft tissue swelling over the extensor surface of the elbow. No evidence of fracture or dislocation. No significant joint effusion. IMPRESSION:  No acute fracture. Mild soft tissue swelling over the extensor surface of  the elbow. Electronically Signed   By: Marin Olp M.D.   On: 05/28/2018 09:18    ROS Blood pressure 111/90, pulse 99, temperature 99.8 F (37.7 C), temperature source Oral, resp. rate 16, height 5\' 4"  (1.626 m), weight 63.5 kg, SpO2 100 %. Physical Exam  Healthy appearing female in moderate distress.  Right UE with pain free AROM of the shoulder elbow and wrist. Left elbow with mod erythema and some swelling extending from the olecranon into the dorsolateral forearm. Compartments are not tense. Patient able to wiggle fingers. No pain with AAROM within a neurtral arc.  No elbow effusion. Distally NVI  Right elbow XRAYs show no fractures, no foreign body and no effusion  Assessment/Plan: Septic left elbow olecranon bursitis. NO evidence of septic arthritis! Empiric IV antibiotics - Ancef should be sufficient Immobilization with long arm padded splint - patient unable to tolerate to this point due to pain.  Recommend splinting to rest the soft tissue to assist with healing.  Will follow, thanks!  Augustin Schooling 05/28/2018, 3:36 PM

## 2018-05-29 DIAGNOSIS — D899 Disorder involving the immune mechanism, unspecified: Secondary | ICD-10-CM

## 2018-05-29 DIAGNOSIS — J82 Pulmonary eosinophilia, not elsewhere classified: Secondary | ICD-10-CM

## 2018-05-29 DIAGNOSIS — M7022 Olecranon bursitis, left elbow: Secondary | ICD-10-CM

## 2018-05-29 LAB — BASIC METABOLIC PANEL
Anion gap: 9 (ref 5–15)
BUN: 6 mg/dL (ref 6–20)
CO2: 23 mmol/L (ref 22–32)
Calcium: 8.3 mg/dL — ABNORMAL LOW (ref 8.9–10.3)
Chloride: 102 mmol/L (ref 98–111)
Creatinine, Ser: 0.58 mg/dL (ref 0.44–1.00)
GFR calc Af Amer: >60 mL/min (ref >60)
GFR calc non Af Amer: >60 mL/min (ref >60)
Glucose, Bld: 95 mg/dL (ref 70–99)
Potassium: 3.1 mmol/L — ABNORMAL LOW (ref 3.5–5.1)
Sodium: 134 mmol/L — ABNORMAL LOW (ref 135–145)

## 2018-05-29 LAB — CBC
HCT: 37.5 % (ref 36.0–46.0)
Hemoglobin: 12.2 g/dL (ref 12.0–15.0)
MCH: 32.4 pg (ref 26.0–34.0)
MCHC: 32.5 g/dL (ref 30.0–36.0)
MCV: 99.5 fL (ref 80.0–100.0)
Platelets: 168 10*3/uL (ref 150–400)
RBC: 3.77 MIL/uL — ABNORMAL LOW (ref 3.87–5.11)
RDW: 14.2 % (ref 11.5–15.5)
WBC: 11.4 10*3/uL — ABNORMAL HIGH (ref 4.0–10.5)
nRBC: 0 % (ref 0.0–0.2)

## 2018-05-29 MED ORDER — ALBUTEROL SULFATE (2.5 MG/3ML) 0.083% IN NEBU: 2.5000 mg | INHALATION_SOLUTION | RESPIRATORY_TRACT | Status: DC | PRN

## 2018-05-29 MED ORDER — DIPHENHYDRAMINE HCL 25 MG PO CAPS
50.0000 mg | ORAL_CAPSULE | Freq: Once | ORAL | Status: AC
  Administered 2018-05-29: 50 mg via ORAL

## 2018-05-29 MED ORDER — POTASSIUM CHLORIDE CRYS ER 20 MEQ PO TBCR
40.0000 meq | EXTENDED_RELEASE_TABLET | Freq: Once | ORAL | Status: AC
  Administered 2018-05-29: 40 meq via ORAL
  Filled 2018-05-29: qty 2

## 2018-05-29 MED ORDER — UMECLIDINIUM BROMIDE 62.5 MCG/INH IN AEPB
2.0000 | INHALATION_SPRAY | Freq: Every day | RESPIRATORY_TRACT | Status: DC
  Administered 2018-05-29 – 2018-05-30 (×2): 2 via RESPIRATORY_TRACT
  Filled 2018-05-29: qty 7

## 2018-05-29 MED ORDER — ALBUTEROL SULFATE HFA 108 (90 BASE) MCG/ACT IN AERS: 2.0000 | INHALATION_SPRAY | Freq: Four times a day (QID) | RESPIRATORY_TRACT | Status: DC | PRN

## 2018-05-29 MED ORDER — MOMETASONE FURO-FORMOTEROL FUM 200-5 MCG/ACT IN AERO
2.0000 | INHALATION_SPRAY | Freq: Two times a day (BID) | RESPIRATORY_TRACT | Status: DC
  Administered 2018-05-29 – 2018-05-30 (×2): 2 via RESPIRATORY_TRACT
  Filled 2018-05-29: qty 8.8

## 2018-05-29 MED ORDER — DIPHENHYDRAMINE HCL 25 MG PO CAPS
25.0000 mg | ORAL_CAPSULE | ORAL | Status: DC | PRN
  Filled 2018-05-29: qty 2

## 2018-05-29 MED ORDER — AZELASTINE-FLUTICASONE 137-50 MCG/ACT NA SUSP: 1.0000 | Freq: Two times a day (BID) | NASAL | Status: DC | PRN

## 2018-05-29 NOTE — Progress Notes (Signed)
PROGRESS NOTE    Tammie Sanders  VHQ:[id number] DOB: 1965/03/23 DOA: 05/28/2018 PCP: [name], MD   Brief Narrative:  HPI on 05/28/2018 by Dr. Marlowe Aschoff Dahal Roy Tammie Sanders is a 53 y.o. female with history of eosinophilic asthma recently started on a monoclonal antibody by the name of dupilumab. It's a subcutaneous injection given every 2 weeks. She had the first dose of that 10 days ago.  Patient presented to [facility] today with complaint of left elbow pain, redness and swelling.  It started and rapidly progressed in last 48 hours.  No fever.  No other joint involvement. In the ED, temperature 99.8, heart rate in the 90s, blood pressure mostly in normal range, breathing comfortably on room air Labs showed WBC count elevated to 19.5, normal hemoglobin and platelets, sodium level slightly low at 133, renal function normal, CRP elevated to 13.8.  Blood culture was sent. Left elbow x-ray showed no acute fracture and mild soft tissue swelling over the extensor surface of the elbow. ED physician discussed the case with orthopedics on-call Dr. Veverly Fells and is recommended, patient was started on IV antibiotics and admitted to [facility].  She also had a compression bandage over the swelling. At the time of my evaluation, patient was complaining of severe pain because of the compression bandage.  I removed it.  Continues to have redness, tenderness and swelling over the left elbow joint area.  No open wound.  There is local tenderness but no pain on actual movement of the elbow. Assessment & Plan   Septic left olecranon bursitis -Sepsis present on admission as patient had elevated leukocytosis as well as tachycardia -Blood cultures pending -Orthopedic surgery consulted and appreciated, recommending IV antibiotics with transition to oral antibiotics and continue for 2 weeks after discharge.  Continue splint, frequent icing and compression.  Follow-up in the office once discharged.   Eosinophilic asthma -Continue prednisone daily -Patient also received subcutaneous dupilumab 10 days prior admission -Continue to follow up with outpatient provider  Hypokalemia -Will replace and continue to monitor BMP  DVT Prophylaxis  Lovenox  Code Status: Full  Family Communication: None at bedside  Disposition Plan: Admitted. Pending blood cultures. Suspect home in 24 hours  Consultants Orthopedic surgery  Procedures  None  Antibiotics   Anti-infectives (From admission, onward)   Start     Dose/Rate Route Frequency Ordered Stop   05/28/18 2200  ceFAZolin (ANCEF) IVPB 1 g/50 mL premix     1 g 100 mL/hr over 30 Minutes Intravenous Every 8 hours 05/28/18 1556     05/28/18 0945  cefTRIAXone (ROCEPHIN) 1 g in sodium chloride 0.9 % 100 mL IVPB     1 g 200 mL/hr over 30 Minutes Intravenous  Once 05/28/18 0938 05/28/18 1037   05/28/18 0945  sulfamethoxazole-trimethoprim (BACTRIM DS) 800-160 MG per tablet 1 tablet     1 tablet Oral  Once 05/28/18 4132 05/28/18 1003      Subjective:   Tammie Sanders seen and examined today.  Continues to have pain in the left elbow and swelling.  Denies current chest pain, shortness breath, abdominal pain, nausea or vomiting, diarrhea constipation, dizziness or headache. Objective:   Vitals:   05/28/18 1433 05/28/18 2152 05/29/18 0636 05/29/18 1428  BP: 111/90 (!) 124/91 (!) 115/56 112/67  Pulse: 99 (!) 104 89 81  Resp: 16 16 18 18   Temp: 99.8 F (37.7 C) 100.2 F (37.9 C) 100.1 F (37.8 C) 98.5 F (36.9 C)  TempSrc: Oral  Oral Oral Oral  SpO2: 100% 98% 94% 96%  Weight:      Height:        Intake/Output Summary (Last 24 hours) at 05/29/2018 1506 Last data filed at 05/29/2018 1400 Gross per 24 hour  Intake 2434.39 ml  Output 1800 ml  Net 634.39 ml   Filed Weights   05/28/18 0826  Weight: 63.5 kg    Exam  General: Well developed, well nourished, NAD, appears stated age  77: NCAT, mucous membranes moist.   Neck:  Supple  Cardiovascular: S1 S2 auscultated, no murmur, RRR  Respiratory: Clear to auscultation bilaterally with equal chest rise  Abdomen: Soft, nontender, nondistended, + bowel sounds  Extremities: warm dry without cyanosis clubbing or edema.  Neuro: AAOx3, nonfocal  Psych: Appropriate mood and affect   Data Reviewed: I have personally reviewed following labs and imaging studies  CBC: Recent Labs  Lab 05/28/18 0850 05/28/18 1446 05/29/18 0417  WBC 19.5* 15.1* 11.4*  NEUTROABS 16.7*  --   --   HGB 13.0 12.3 12.2  HCT 38.3 37.3 37.5  MCV 94.3 96.4 99.5  PLT 209 207 010   Basic Metabolic Panel: Recent Labs  Lab 05/28/18 0850 05/28/18 1446 05/29/18 0417  NA 133*  --  134*  K 3.5  --  3.1*  CL 101  --  102  CO2 24  --  23  GLUCOSE 100*  --  95  BUN 14  --  6  CREATININE 0.58 0.50 0.58  CALCIUM 8.6*  --  8.3*   GFR: Estimated Creatinine Clearance: 70.2 mL/min (by C-G formula based on SCr of 0.58 mg/dL). Liver Function Tests: No results for input(s): AST, ALT, ALKPHOS, BILITOT, PROT, ALBUMIN in the last 168 hours. No results for input(s): LIPASE, AMYLASE in the last 168 hours. No results for input(s): AMMONIA in the last 168 hours. Coagulation Profile: No results for input(s): INR, PROTIME in the last 168 hours. Cardiac Enzymes: No results for input(s): CKTOTAL, CKMB, CKMBINDEX, TROPONINI in the last 168 hours. BNP (last 3 results) No results for input(s): PROBNP in the last 8760 hours. HbA1C: No results for input(s): HGBA1C in the last 72 hours. CBG: No results for input(s): GLUCAP in the last 168 hours. Lipid Profile: No results for input(s): CHOL, HDL, LDLCALC, TRIG, CHOLHDL, LDLDIRECT in the last 72 hours. Thyroid Function Tests: No results for input(s): TSH, T4TOTAL, FREET4, T3FREE, THYROIDAB in the last 72 hours. Anemia Panel: No results for input(s): VITAMINB12, FOLATE, FERRITIN, TIBC, IRON, RETICCTPCT in the last 72 hours. Urine analysis:     Component Value Date/Time   COLORURINE YELLOW 12/16/2009 0937   APPEARANCEUR CLEAR 12/16/2009 0937   LABSPEC 1.015 12/16/2009 0937   PHURINE 6.0 12/16/2009 0937   GLUCOSEU NEGATIVE 12/16/2009 0937   HGBUR LARGE (A) 12/16/2009 0937   BILIRUBINUR NEGATIVE 12/16/2009 0937   KETONESUR NEGATIVE 12/16/2009 0937   PROTEINUR NEGATIVE 12/16/2009 0937   UROBILINOGEN 0.2 12/16/2009 0937   NITRITE NEGATIVE 12/16/2009 0937   LEUKOCYTESUR NEGATIVE 12/16/2009 0937   Sepsis Labs: @LABRCNTIP (procalcitonin:4,lacticidven:4)  ) Recent Results (from the past 240 hour(s))  Culture, blood (routine x 2)     Status: None (Preliminary result)   Collection Time: 05/28/18 10:02 AM  Result Value Ref Range Status   Specimen Description   Final    BLOOD RIGHT WRIST Performed at [facility], [address] [place], [place] [address]    Special Requests   Final    BOTTLES DRAWN AEROBIC AND  ANAEROBIC Blood Culture adequate volume Performed at [facility] [address] [place], [place] [address]    Culture   Final    NO GROWTH 1 DAY Performed at [facility], [place] [address] [place], [place] [address]    Report Status PENDING  Incomplete  Culture, blood (routine x 2)     Status: None (Preliminary result)   Collection Time: 05/28/18 10:02 AM  Result Value Ref Range Status   Specimen Description   Final    BLOOD RIGHT HAND Performed at [facility], [address] [place], [place] [address]    Special Requests   Final    BOTTLES DRAWN AEROBIC AND ANAEROBIC Blood Culture adequate volume Performed at [facility], [address] [place], [place] [address]    Culture   Final    NO GROWTH 1 DAY Performed at [facility], [place] [address], [place], [place] [address]    Report Status PENDING  Incomplete      Radiology Studies: Dg Elbow Complete Left  Result Date: 05/28/2018 CLINICAL DATA:  Two days left elbow pain and swelling with redness.  EXAM: LEFT ELBOW - COMPLETE 3+ VIEW COMPARISON:  None. FINDINGS: Minimal soft tissue swelling over the extensor surface of the elbow. No evidence of fracture or dislocation. No significant joint effusion. IMPRESSION: No acute fracture. Mild soft tissue swelling over the extensor surface of the elbow. Electronically Signed   By: Marin Olp M.D.   On: 05/28/2018 09:18     Scheduled Meds: . enoxaparin (LOVENOX) injection  40 mg Subcutaneous Q24H  . FLUoxetine  40 mg Oral Daily  . metoprolol succinate  25 mg Oral Daily  . montelukast  10 mg Oral QHS  . oxybutynin  10 mg Oral Daily  . predniSONE  10 mg Oral Q breakfast  . senna  1 tablet Oral BID  . sodium chloride flush  3 mL Intravenous Q12H   Continuous Infusions: . sodium chloride 100 mL/hr at 05/28/18 2345  .  ceFAZolin (ANCEF) IV 1 g (05/29/18 0609)     LOS: 1 day   Time Spent in minutes   30 minutes  Wisdom Rickey D.O. on 05/29/2018 at 3:06 PM  Between 7am to 7pm - Please see pager noted on [email or web address]  After 7pm go to [email or web address]  And look for the night coverage person covering for me after hours  [facility] Office  [phone]

## 2018-05-29 NOTE — Progress Notes (Signed)
   Subjective:    Recheck left elbow s/p splinting and IV antibiotics Pt feeling better with decreased pain to posterior elbow Denies any new symptoms Currently awaiting blood culture results prior to d/c  Denies any new symptoms or issues  Patient reports pain as mild.  Objective:   VITALS:   Vitals:   05/28/18 2152 05/29/18 0636  BP: (!) 124/91 (!) 115/56  Pulse: (!) 104 89  Resp: 16 18  Temp: 100.2 F (37.9 C) 100.1 F (37.8 C)  SpO2: 98% 94%    Left elbow: mild edema to olecranon area with mild erythema Full rom with decreased pain nv intact distally No rashes or signs of infection distally  LABS Recent Labs    05/28/18 0850 05/28/18 1446 05/29/18 0417  HGB 13.0 12.3 12.2  HCT 38.3 37.3 37.5  WBC 19.5* 15.1* 11.4*  PLT 209 207 168    Recent Labs    05/28/18 0850 05/28/18 1446 05/29/18 0417  NA 133*  --  134*  K 3.5  --  3.1*  BUN 14  --  6  CREATININE 0.58 0.50 0.58  GLUCOSE 100*  --  95     Assessment/Plan:   Left elbow olecranon bursitis Continue IV antibiotics and transition to oral antibiotics  Recommend antibiotics for 2 weeks after discharge Continue the splint, frequent icing and compression Stable from orthopedic standpoint for d/c once cultures return May follow up in the office once discharged Will continue to monitor her progress but no surgery indicated at this time     Merla Riches PA-C, [facility] [employer]  [facility] [address], [place], [place] [address] Phone: [phone] [email or web address] Facebook  [employer]

## 2018-05-30 LAB — BASIC METABOLIC PANEL
Anion gap: 7 (ref 5–15)
BUN: 7 mg/dL (ref 6–20)
CO2: 24 mmol/L (ref 22–32)
Calcium: 8.3 mg/dL — ABNORMAL LOW (ref 8.9–10.3)
Chloride: 107 mmol/L (ref 98–111)
Creatinine, Ser: 0.57 mg/dL (ref 0.44–1.00)
GFR calc Af Amer: >60 mL/min (ref >60)
GFR calc non Af Amer: >60 mL/min (ref >60)
Glucose, Bld: 100 mg/dL — ABNORMAL HIGH (ref 70–99)
Potassium: 3.3 mmol/L — ABNORMAL LOW (ref 3.5–5.1)
Sodium: 138 mmol/L (ref 135–145)

## 2018-05-30 LAB — CBC
HCT: 32.8 % — ABNORMAL LOW (ref 36.0–46.0)
Hemoglobin: 10.7 g/dL — ABNORMAL LOW (ref 12.0–15.0)
MCH: 32.5 pg (ref 26.0–34.0)
MCHC: 32.6 g/dL (ref 30.0–36.0)
MCV: 99.7 fL (ref 80.0–100.0)
Platelets: 176 10*3/uL (ref 150–400)
RBC: 3.29 MIL/uL — ABNORMAL LOW (ref 3.87–5.11)
RDW: 13.9 % (ref 11.5–15.5)
WBC: 10.1 10*3/uL (ref 4.0–10.5)
nRBC: 0 % (ref 0.0–0.2)

## 2018-05-30 LAB — HIV ANTIBODY (ROUTINE TESTING W REFLEX): HIV Screen 4th Generation wRfx: NONREACTIVE

## 2018-05-30 LAB — MAGNESIUM: Magnesium: 2 mg/dL (ref 1.7–2.4)

## 2018-05-30 MED ORDER — POTASSIUM CHLORIDE CRYS ER 20 MEQ PO TBCR
40.0000 meq | EXTENDED_RELEASE_TABLET | Freq: Once | ORAL | Status: AC
  Administered 2018-05-30: 40 meq via ORAL
  Filled 2018-05-30: qty 2

## 2018-05-30 MED ORDER — AZELASTINE HCL 0.1 % NA SOLN: 1.0000 | Freq: Two times a day (BID) | NASAL | Status: DC | PRN

## 2018-05-30 MED ORDER — CEPHALEXIN 500 MG PO CAPS: 500.0000 mg | ORAL_CAPSULE | Freq: Four times a day (QID) | ORAL | 0 refills | Status: AC

## 2018-05-30 MED ORDER — FLUTICASONE PROPIONATE 50 MCG/ACT NA SUSP: 1.0000 | Freq: Two times a day (BID) | NASAL | Status: DC | PRN

## 2018-05-30 NOTE — Discharge Instructions (Signed)
Elbow Bursitis  Bursitis is swelling and pain at the tip of your elbow. This happens when fluid builds up in a sac under your skin (bursa). This may also be called olecranon bursitis.  Follow these instructions at home:  Medicines   Take over-the-counter and prescription medicines only as told by your doctor.   If you were prescribed an antibiotic, take it exactly as told by your doctor. Do not stop taking it even if you start to feel better.  Managing pain, stiffness, and swelling     If told, put ice on your elbow:  ? Put ice in a plastic bag.  ? Place a towel between your skin and the bag.  ? Leave the ice on for 20 minutes, 2-3 times a day.   If your bursitis is caused by an injury, follow instructions from your doctor about:  ? Resting your elbow.  ? Wearing a bandage.   Wear elbow pads or elbow wraps as needed. These help cushion your elbow.  General instructions   Avoid any activities that cause elbow pain. Ask your doctor what activities are safe for you.   Keep all follow-up visits as told by your doctor. This is important.  Contact a doctor if you have:   A fever.   Problems that do not get better with treatment.   Pain or swelling that:  ? Gets worse.  ? Goes away and then comes back.   Pus draining from your elbow.  Get help right away if you have:   Trouble moving your arm, hand, or fingers.  Summary   Bursitis is swelling and pain at the tip of the elbow.   You may need to take medicine or put ice on your elbow.   Contact your doctor if your problems do not get better with treatment.  This information is not intended to replace advice given to you by your health care provider. Make sure you discuss any questions you have with your health care provider.  Document Released: 07/06/2009 Document Revised: 12/26/2016 Document Reviewed: 12/26/2016  Elsevier Interactive Patient Education  2019 Elsevier Inc[employer]

## 2018-05-30 NOTE — Discharge Summary (Addendum)
Physician Discharge Summary  Tammie Sanders QMV:[id number] DOB: 1965-03-18 DOA: 05/28/2018  PCP: Tammie Lass, MD  Admit date: 05/28/2018 Discharge date: 05/30/2018  Time spent: 45 minutes  Recommendations for Outpatient Follow-up:  Patient will be discharged to home.  Patient will need to follow up with primary care provider within one week of discharge, repeat BMP. Follow up with orthopedic surgery.  Patient should continue medications as prescribed.  Patient should follow a regular diet.   Discharge Diagnoses:  Septic left olecranon bursitis Eosinophilic asthma Hypokalemia  Discharge Condition: Stable  Diet recommendation: Regular  Filed Weights   05/28/18 0826  Weight: 63.5 kg    History of present illness:  on 05/28/2018 by Dr. [name] a 53 y.o.femalewith history of eosinophilic asthma recently started ona monoclonal antibodybythe name of dupilumab. It's asubcutaneous injection given every 2 weeks. She had the first dose of that 10 days ago. Patient presented to [facility] today with complaint of left elbow pain, redness and swelling. It started and rapidly progressed in last 48 hours. No fever. No other joint involvement. In the ED, temperature 99.8, heart rate in the 90s, blood pressure mostly in normal range, breathing comfortably on room air Labs showed WBC count elevated to 19.5, normal hemoglobin and platelets, sodium level slightly low at 133, renal function normal, CRP elevated to 13.8. Blood culture was sent. Left elbow x-ray showedno acute fractureand mild soft tissue swelling over the extensor surface of the elbow. ED physician discussed the case with orthopedics on-call Dr. Veverly Fells and is recommended, [facility] She also had a compression bandage over the swelling. At the time of my evaluation, patient was complaining of severe pain because of the compression  bandage. I removed it. Continues to have redness, tenderness and swelling over the left elbow joint area. No open wound. There is local tenderness but no pain on actual movement of the elbow.  Hospital Course:  Septic left olecranon bursitis -Sepsis present on admission as patient had elevated leukocytosis as well as tachycardia -Blood cultures show no growth to date -Was placed on Ancef -Orthopedic surgery consulted and appreciated, recommending IV antibiotics with transition to oral antibiotics and continue for 2 weeks after discharge.  Continue splint, frequent icing and compression.  Follow-up in the office once discharged. -will discharge patient with keflex 500mg  M8UX  Eosinophilic asthma -Continue prednisone daily -Patient also received subcutaneous dupilumab 10 days prior admission -Continue to follow up with outpatient provider  Hypokalemia -replaced, repeat BMP in one week -magnesium 2  Hyponatremia -patient was on IVF -resolved  Consultants Orthopedic surgery  Procedures  None  Discharge Exam: Vitals:   05/30/18 0622 05/30/18 0823  BP: 128/86   Pulse: 84   Resp: 16   Temp: 98.2 F (36.8 C)   SpO2: 93% 94%     General: Well developed, well nourished, NAD, appears stated age  HEENT: NCAT, mucous membranes moist.  Cardiovascular: S1 S2 auscultated, RRR, No murmur  Respiratory: Clear to auscultation bilaterally   Abdomen: Soft, nontender, nondistended, + bowel sounds  Extremities: warm dry without cyanosis clubbing or edema of LE. Left elbow with erythema, mild edema (improving) of the olecranon area  Neuro: AAOx3, nonfocal  Psych: Pleasant, appropriate mood and affect  Discharge Instructions Discharge Instructions    Discharge instructions   Complete by:  As directed    Patient will be discharged to home.  Patient will need to follow up with primary care  provider within one week of discharge, repeat BMP. Follow up with orthopedic surgery.   Patient should continue medications as prescribed.  Patient should follow a regular diet.     Allergies as of 05/30/2018      Reactions   Mold Extract [trichophyton] Shortness Of Breath   Clindamycin/lincomycin    Depakote [divalproex Sodium] Other (See Comments)   Weight gain   Levaquin [levofloxacin]    Other    Topamax [topiramate] Other (See Comments)   Muscle pain   Tegretol [carbamazepine] Rash      Medication List    TAKE these medications   albuterol (2.5 MG/3ML) 0.083% nebulizer solution Commonly known as:  PROVENTIL Take 2.5 mg by nebulization every 4 (four) hours as needed for wheezing or shortness of breath.   albuterol 108 (90 Base) MCG/ACT inhaler Commonly known as:  VENTOLIN HFA Inhale 2 puffs into the lungs every 6 (six) hours as needed for wheezing or shortness of breath.   cephALEXin 500 MG capsule Commonly known as:  KEFLEX Take 1 capsule (500 mg total) by mouth 4 (four) times daily for 14 days.   DUPIXENT Venturia Inject into the skin.   Dymista 137-50 MCG/ACT Susp Generic drug:  Azelastine-Fluticasone Place 1 puff into the nose 2 (two) times daily as needed (congestion).   EPINEPHrine 0.3 mg/0.3 mL Soaj injection Commonly known as:  EPI-PEN Inject 0.3 mLs (0.3 mg total) into the muscle once.   FLUoxetine 40 MG capsule Commonly known as:  PROZAC Take 40 mg by mouth daily.   ibuprofen 200 MG tablet Commonly known as:  ADVIL Take 200 mg by mouth every 6 (six) hours as needed for headache or mild pain.   metoprolol succinate 25 MG 24 hr tablet Commonly known as:  TOPROL-XL Take 25 mg by mouth daily.   montelukast 10 MG tablet Commonly known as:  SINGULAIR Take 10 mg by mouth at bedtime.   oxybutynin 10 MG 24 hr tablet Commonly known as:  DITROPAN-XL Take 10 mg by mouth daily.   predniSONE 10 MG tablet Commonly known as:  DELTASONE 2tabs (20mg ) daily What changed:    how much to take  how to take this  when to take this  additional  instructions   Spiriva Respimat 1.25 MCG/ACT Aers Generic drug:  Tiotropium Bromide Monohydrate Inhale 2 puffs into the lungs daily.   Symbicort 160-4.5 MCG/ACT inhaler Generic drug:  budesonide-formoterol Inhale 2 puffs into the lungs 2 (two) times daily.      Allergies  Allergen Reactions  . Mold Extract [Trichophyton] Shortness Of Breath  . Clindamycin/Lincomycin   . Depakote [Divalproex Sodium] Other (See Comments)    Weight gain  . Levaquin [Levofloxacin]   . Other   . Topamax [Topiramate] Other (See Comments)    Muscle pain  . Tegretol [Carbamazepine] Rash   Follow-up Information    Tammie Lass, MD. Schedule an appointment as soon as possible for a visit in 1 week(s).   Specialty:  Family Medicine Why:  Hospital follow up Contact information: [place] [address] [phone]        Netta Cedars, MD. Schedule an appointment as soon as possible for a visit in 1 week(s).   Specialty:  Orthopedic Surgery Why:  Hospital follow up Contact information: [address] [place] [address] [phone]            The results of significant diagnostics from this hospitalization (including imaging, microbiology, ancillary and laboratory) are listed  below for reference.    Significant Diagnostic Studies: Dg Elbow Complete Left  Result Date: 05/28/2018 CLINICAL DATA:  Two days left elbow pain and swelling with redness. EXAM: LEFT ELBOW - COMPLETE 3+ VIEW COMPARISON:  None. FINDINGS: Minimal soft tissue swelling over the extensor surface of the elbow. No evidence of fracture or dislocation. No significant joint effusion. IMPRESSION: No acute fracture. Mild soft tissue swelling over the extensor surface of the elbow. Electronically Signed   By: Marin Olp M.D.   On: 05/28/2018 09:18    Microbiology: Recent Results (from the past 240 hour(s))  Culture, blood (routine x 2)     Status: None (Preliminary result)   Collection  Time: 05/28/18 10:02 AM  Result Value Ref Range Status   Specimen Description   Final    BLOOD RIGHT WRIST Performed at [facility], [address] [place], [place] [address]    Special Requests   Final    BOTTLES DRAWN AEROBIC AND ANAEROBIC Blood Culture adequate volume Performed at [facility], [address] [place], [place] [address]    Culture   Final    NO GROWTH 2 DAYS Performed at [facility], [place] [address], [place], [place] [address]    Report Status PENDING  Incomplete  Culture, blood (routine x 2)     Status: None (Preliminary result)   Collection Time: 05/28/18 10:02 AM  Result Value Ref Range Status   Specimen Description   Final    BLOOD RIGHT HAND Performed at [facility], [address] [place], [place] [address]    Special Requests   Final    BOTTLES DRAWN AEROBIC AND ANAEROBIC Blood Culture adequate volume Performed at [facility], [address] [place], [place] [address]    Culture   Final    NO GROWTH 2 DAYS Performed at [facility], [place] [address], [place], [place] [address]    Report Status PENDING  Incomplete     Labs: Basic Metabolic Panel: Recent Labs  Lab 05/28/18 0850 05/28/18 1446 05/29/18 0417 05/30/18 0431  NA 133*  --  134* 138  K 3.5  --  3.1* 3.3*  CL 101  --  102 107  CO2 24  --  23 24  GLUCOSE 100*  --  95 100*  BUN 14  --  6 7  CREATININE 0.58 0.50 0.58 0.57  CALCIUM 8.6*  --  8.3* 8.3*  MG  --   --   --  2.0   Liver Function Tests: No results for input(s): AST, ALT, ALKPHOS, BILITOT, PROT, ALBUMIN in the last 168 hours. No results for input(s): LIPASE, AMYLASE in the last 168 hours. No results for input(s): AMMONIA in the last 168 hours. CBC: Recent Labs  Lab 05/28/18 0850 05/28/18 1446 05/29/18 0417 05/30/18 0431  WBC 19.5* 15.1* 11.4* 10.1  NEUTROABS 16.7*  --   --   --   HGB 13.0 12.3 12.2 10.7*  HCT 38.3 37.3 37.5 32.8*  MCV 94.3 96.4 99.5 99.7   PLT 209 207 168 176   Cardiac Enzymes: No results for input(s): CKTOTAL, CKMB, CKMBINDEX, TROPONINI in the last 168 hours. BNP: BNP (last 3 results) No results for input(s): BNP in the last 8760 hours.  ProBNP (last 3 results) No results for input(s): PROBNP in the last 8760 hours.  CBG: No results for input(s): GLUCAP in the last 168 hours.  Signed:  Cristal Ford  [facility] 05/30/2018, 9:12 AM

## 2018-05-30 NOTE — Plan of Care (Signed)
Patient discharged home in stable condition. Waiting on her spouse to pick her up

## 2018-05-30 NOTE — Plan of Care (Signed)
  Problem: Education: Goal: Knowledge of General Education information will improve Description Including pain rating scale, medication(s)/side effects and non-pharmacologic comfort measures Outcome: Progressing   Problem: Health Behavior/Discharge Planning: Goal: Ability to manage health-related needs will improve Outcome: Progressing   Problem: Activity: Goal: Risk for activity intolerance will decrease Outcome: Progressing   Problem: Safety: Goal: Ability to remain free from injury will improve Outcome: Progressing   Problem: Pain Managment: Goal: General experience of comfort will improve Outcome: Progressing

## 2018-05-31 ENCOUNTER — Ambulatory Visit (INDEPENDENT_AMBULATORY_CARE_PROVIDER_SITE_OTHER): Payer: No Typology Code available for payment source

## 2018-05-31 ENCOUNTER — Other Ambulatory Visit: Payer: Self-pay

## 2018-05-31 DIAGNOSIS — J4551 Severe persistent asthma with (acute) exacerbation: Secondary | ICD-10-CM | POA: Diagnosis not present

## 2018-05-31 MED ORDER — DUPILUMAB 300 MG/2ML ~~LOC~~ SOSY
300.0000 mg | PREFILLED_SYRINGE | Freq: Once | SUBCUTANEOUS | Status: AC
  Administered 2018-05-31: 16:00:00 300 mg via SUBCUTANEOUS

## 2018-06-02 LAB — CULTURE, BLOOD (ROUTINE X 2)
Culture: NO GROWTH
Culture: NO GROWTH
Special Requests: ADEQUATE
Special Requests: ADEQUATE

## 2018-06-07 ENCOUNTER — Telehealth: Payer: Self-pay | Admitting: Pulmonary Disease

## 2018-06-07 NOTE — Telephone Encounter (Signed)
Called [facility] back.  Dupixant being sent and will arrive on 06/11/18.  Pt appt is 05/15.  Nothing further is needed.

## 2018-06-11 ENCOUNTER — Telehealth: Payer: Self-pay | Admitting: *Deleted

## 2018-06-11 NOTE — Telephone Encounter (Signed)
Dupixent Shipment Received: 300mg  #1 prefilled syringe Medication arrival date: 06/11/2018 Lot #: [id number] Exp date: 10/2020 Received by: TBS

## 2018-06-14 ENCOUNTER — Other Ambulatory Visit: Payer: Self-pay

## 2018-06-14 ENCOUNTER — Ambulatory Visit (INDEPENDENT_AMBULATORY_CARE_PROVIDER_SITE_OTHER): Payer: No Typology Code available for payment source

## 2018-06-14 DIAGNOSIS — J4551 Severe persistent asthma with (acute) exacerbation: Secondary | ICD-10-CM | POA: Diagnosis not present

## 2018-06-14 MED ORDER — DUPILUMAB 300 MG/2ML ~~LOC~~ SOSY
300.0000 mg | PREFILLED_SYRINGE | Freq: Once | SUBCUTANEOUS | Status: AC
  Administered 2018-06-14: 16:00:00 300 mg via SUBCUTANEOUS

## 2018-06-14 NOTE — Progress Notes (Signed)
Have you been hospitalized in the last 10 days? No Do you have a fever? No Do you have a cough? No Do you have a headache or sore throat? No  

## 2018-07-01 ENCOUNTER — Other Ambulatory Visit: Payer: Self-pay

## 2018-07-01 ENCOUNTER — Ambulatory Visit (INDEPENDENT_AMBULATORY_CARE_PROVIDER_SITE_OTHER): Payer: No Typology Code available for payment source

## 2018-07-01 DIAGNOSIS — J4551 Severe persistent asthma with (acute) exacerbation: Secondary | ICD-10-CM | POA: Diagnosis not present

## 2018-07-01 MED ORDER — DUPILUMAB 300 MG/2ML ~~LOC~~ SOSY
300.0000 mg | PREFILLED_SYRINGE | Freq: Once | SUBCUTANEOUS | Status: AC
  Administered 2018-07-01: 17:00:00 300 mg via SUBCUTANEOUS

## 2018-07-01 NOTE — Progress Notes (Signed)
Have you been hospitalized within the last 10 days?  No Do you have a fever?  No Do you have a cough?  No Do you have a headache or sore throat? No  

## 2018-07-04 ENCOUNTER — Telehealth: Payer: Self-pay | Admitting: Pulmonary Disease

## 2018-07-04 NOTE — Telephone Encounter (Signed)
Dupixent Shipment Received: 300mg  #1 prefilled syringe Medication arrival date: 07/04/2018 Lot #: [id number] Exp date: 10/2020  Received by: Desmond Dike, [place]

## 2018-07-15 ENCOUNTER — Other Ambulatory Visit: Payer: Self-pay

## 2018-07-15 ENCOUNTER — Ambulatory Visit (INDEPENDENT_AMBULATORY_CARE_PROVIDER_SITE_OTHER): Payer: No Typology Code available for payment source

## 2018-07-15 DIAGNOSIS — J4551 Severe persistent asthma with (acute) exacerbation: Secondary | ICD-10-CM

## 2018-07-15 MED ORDER — DUPILUMAB 300 MG/2ML ~~LOC~~ SOSY
300.0000 mg | PREFILLED_SYRINGE | Freq: Once | SUBCUTANEOUS | Status: AC
  Administered 2018-07-15: 17:00:00 300 mg via SUBCUTANEOUS

## 2018-07-15 NOTE — Progress Notes (Signed)
All questions were answered by the patient before medication was administered. Have you been hospitalized in the last 10 days? No Do you have a fever? No Do you have a cough? No Do you have a headache or sore throat? No  

## 2018-07-16 ENCOUNTER — Other Ambulatory Visit: Payer: Self-pay | Admitting: Pulmonary Disease

## 2018-07-23 ENCOUNTER — Other Ambulatory Visit (HOSPITAL_COMMUNITY)
Admission: RE | Admit: 2018-07-23 | Discharge: 2018-07-23 | Disposition: A | Discharge status: Home / Self Care | Payer: No Typology Code available for payment source | Source: Ambulatory Visit | Attending: Pulmonary Disease | Admitting: Pulmonary Disease

## 2018-07-23 DIAGNOSIS — Z1159 Encounter for screening for other viral diseases: Secondary | ICD-10-CM | POA: Insufficient documentation

## 2018-07-23 LAB — SARS CORONAVIRUS 2 (TAT 6-24 HRS): SARS Coronavirus 2: NEGATIVE

## 2018-07-26 ENCOUNTER — Other Ambulatory Visit: Payer: Self-pay

## 2018-07-26 ENCOUNTER — Ambulatory Visit (INDEPENDENT_AMBULATORY_CARE_PROVIDER_SITE_OTHER): Payer: No Typology Code available for payment source | Admitting: Nurse Practitioner

## 2018-07-26 ENCOUNTER — Encounter: Payer: Self-pay | Admitting: Nurse Practitioner

## 2018-07-26 ENCOUNTER — Ambulatory Visit (INDEPENDENT_AMBULATORY_CARE_PROVIDER_SITE_OTHER): Payer: No Typology Code available for payment source | Admitting: Pulmonary Disease

## 2018-07-26 VITALS — BP 126/70 | HR 85 | Temp 98.0°F | Ht 63.5 in | Wt 144.0 lb

## 2018-07-26 DIAGNOSIS — J82 Pulmonary eosinophilia, not elsewhere classified: Secondary | ICD-10-CM

## 2018-07-26 DIAGNOSIS — R05 Cough: Secondary | ICD-10-CM | POA: Diagnosis not present

## 2018-07-26 DIAGNOSIS — J849 Interstitial pulmonary disease, unspecified: Secondary | ICD-10-CM

## 2018-07-26 DIAGNOSIS — J8283 Eosinophilic asthma: Secondary | ICD-10-CM

## 2018-07-26 DIAGNOSIS — R059 Cough, unspecified: Secondary | ICD-10-CM

## 2018-07-26 LAB — PULMONARY FUNCTION TEST
DL/VA % pred: 110 %
DL/VA: 4.75 ml/min/mmHg/L
DLCO unc % pred: 116 %
DLCO unc: 23.82 ml/min/mmHg
FEF 25-75 Post: 2.74 L/sec
FEF 25-75 Pre: 1.58 L/sec
FEF2575-%Change-Post: 73 %
FEF2575-%Pred-Post: 104 %
FEF2575-%Pred-Pre: 60 %
FEV1-%Change-Post: 15 %
FEV1-%Pred-Post: 98 %
FEV1-%Pred-Pre: 84 %
FEV1-Post: 2.64 L
FEV1-Pre: 2.28 L
FEV1FVC-%Change-Post: 13 %
FEV1FVC-%Pred-Pre: 88 %
FEV6-%Change-Post: 5 %
FEV6-%Pred-Post: 100 %
FEV6-%Pred-Pre: 95 %
FEV6-Post: 3.32 L
FEV6-Pre: 3.16 L
FEV6FVC-%Change-Post: 0 %
FEV6FVC-%Pred-Post: 102 %
FEV6FVC-%Pred-Pre: 102 %
FVC-%Change-Post: 2 %
FVC-%Pred-Post: 97 %
FVC-%Pred-Pre: 94 %
FVC-Post: 3.32 L
FVC-Pre: 3.24 L
Post FEV1/FVC ratio: 80 %
Post FEV6/FVC ratio: 100 %
Pre FEV1/FVC ratio: 70 %
Pre FEV6/FVC Ratio: 99 %
RV % pred: 121 %
RV: 2.21 L
TLC % pred: 107 %
TLC: 5.34 L

## 2018-07-26 MED ORDER — BUDESONIDE-FORMOTEROL FUMARATE 80-4.5 MCG/ACT IN AERO: 2.0000 | INHALATION_SPRAY | Freq: Two times a day (BID) | RESPIRATORY_TRACT | 0 refills | Status: DC

## 2018-07-26 NOTE — Assessment & Plan Note (Addendum)
Patient is doing well.  She states that Dupixent injections have changed her life.  She no longer has any asthma symptoms.  She states that she quit all of her inhalers and prednisone.  Discussed that she should still continue Symbicort and albuterol as needed.  We did decrease the dosage of Symbicort to 80.  Discussed PFT in office today  Patient Instructions  Continue Albuterol as needed Continue Symbicort twice daily - will decrease dosage to 80 Continue dupixent injections   Follow up: Follow up with Dr. Vaughan Browner in 3 months or sooner if needed

## 2018-07-26 NOTE — Progress Notes (Signed)
@Patient  ID: Tammie Sanders, female    DOB: 1965-05-14, 53 y.o.   MRN: [id number]  Chief Complaint  Patient presents with  . 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 Vaughan Browner.  Tests: HRCT 04/11/18 - No evidence of fibrotic interstitial lung disease. No significant air trapping.  PFT: PFT Results Latest Ref Rng & Units 07/26/2018  FVC-Pre L 3.24  FVC-Predicted Pre % 94  FVC-Post L 3.32  FVC-Predicted Post % 97  Pre FEV1/FVC % % 70  Post FEV1/FCV % % 80  FEV1-Pre L 2.28  FEV1-Predicted Pre % 84  FEV1-Post L 2.64  DLCO UNC% % 116  DLCO COR %Predicted % 110  TLC L 5.34  TLC % Predicted % 107  RV % Predicted % 121     Dupilumab SOSY 300 mg    Date Action Dose Route User   05/31/2018 1607 Given 300 mg Subcutaneous (Right Arm) Randa Spike, CMA    Dupilumab SOSY 300 mg    Date Action Dose Route User   06/14/2018 1616 Given 300 mg Subcutaneous (Left Arm) Randa Spike, CMA    Dupilumab SOSY 300 mg    Date Action Dose Route User   07/01/2018 1654 Given 300 mg Subcutaneous (Right Arm) Scott, Tammy B    Dupilumab SOSY 300 mg    Date Action Dose Route User   07/15/2018 1635 Given 300 mg Subcutaneous (Right Arm) Randa Spike, CMA       OV 07/26/18 - Follow up with PFT Patient presents today for follow-up on asthma.  Patient was last seen by Dr. Vaughan Browner on 03/21/2018 and was found to have elevated eosinophils and allergy to mold.  Patient was started on Dupixent.  Her first injection was in May.  She states that the [place] has changed her life.  Patient states that she has stopped taking all of her inhalers and prednisone.  She does still take Singulair daily.  She has not needed her rescue inhaler in the past month.  Denies any shortness of breath or wheezing.  She denies any significant cough.  Patient also had positive ANA and was referred to rheumatology.  Denies f/c/s, n/v/d,  hemoptysis, PND, leg swelling.   Note: Patient answers 0 to each question on asthma control questionnaire.  FEV1 today was 84.   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  No ongoing exposure.  No mold, hot tub, Jacuzzi Smoking history: 5-pack-year smoker.  Quit in 1990. Travel history: No significant travel history Relevant family history: Mother has asthma, rheumatoid arthritis and lupus. ACQ6 03/21/18- 5.12    Allergies  Allergen Reactions  . Mold Extract [Trichophyton] Shortness Of Breath  . Amoxicillin-Pot Clavulanate     Closed up throat, could not swallow.  . Clindamycin/Lincomycin   . Depakote [Divalproex Sodium] Other (See Comments)    Weight gain  . Levaquin [Levofloxacin]   . Other   . Topamax [Topiramate] Other (See Comments)    Muscle pain  . Tegretol [Carbamazepine] Rash    Immunization History  Administered Date(s) Administered  . DT 01/17/2002  . Influenza Inj Mdck Quad Pf 02/05/2018  . Influenza Split 03/02/2012  . Pneumococcal Polysaccharide-23 10/16/2014    Past Medical History:  Diagnosis Date  . Anal fissure   . Anxiety   .  Asthma    Fall of 2014 [facility])   . Chronic headaches   . PE (pulmonary embolism) 1991   After C-section  . Pneumonia   . PONV (postoperative nausea and vomiting)   . 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 Sinusitis   . Skin cancer    basal cell  . Tuberculosis    tested positive to exposure; xray clear    Tobacco History: Social History   Tobacco Use  Smoking Status Former Smoker  . Packs/day: 1.00  . Years: 5.00  . Pack years: 5.00  . Types: Cigarettes  . Quit date: 01/31/1988  . Years since quitting: 30.5  Smokeless Tobacco Never Used   Counseling given: Yes   Outpatient Encounter Medications as of 07/26/2018  Medication Sig  . Dupilumab (DUPIXENT  [place]) Inject into the skin.  Marland Kitchen EPINEPHrine 0.3 mg/0.3 mL IJ SOAJ injection Inject 0.3 mLs (0.3 mg total) into the muscle once.  Marland Kitchen FLUoxetine (PROZAC) 40 MG capsule Take 40 mg by mouth daily.  Marland Kitchen ibuprofen (ADVIL) 200 MG tablet Take 200 mg by mouth every 6 (six) hours as needed for headache or mild pain.  . metoprolol succinate (TOPROL-XL) 25 MG 24 hr tablet Take 25 mg by mouth daily.  . montelukast (SINGULAIR) 10 MG tablet Take 10 mg by mouth at bedtime.  Marland Kitchen oxybutynin (DITROPAN-XL) 10 MG 24 hr tablet Take 10 mg by mouth daily.   Marland Kitchen albuterol (PROVENTIL HFA;VENTOLIN HFA) 108 (90 Base) MCG/ACT inhaler Inhale 2 puffs into the lungs every 6 (six) hours as needed for wheezing or shortness of breath.   Marland Kitchen albuterol (PROVENTIL) (2.5 MG/3ML) 0.083% nebulizer solution Take 2.5 mg by nebulization every 4 (four) hours as needed for wheezing or shortness of breath.   . budesonide-formoterol (SYMBICORT) 80-4.5 MCG/ACT inhaler Inhale 2 puffs into the lungs 2 (two) times a day.  . DYMISTA 137-50 MCG/ACT SUSP Place 1 puff into the nose 2 (two) times daily as needed (congestion).   . predniSONE (DELTASONE) 10 MG tablet 2tabs (20mg ) daily (Patient not taking: Reported on 07/26/2018)  . SPIRIVA RESPIMAT 1.25 MCG/ACT AERS Inhale 2 puffs into the lungs daily.   . SYMBICORT 160-4.5 MCG/ACT inhaler Inhale 2 puffs into the lungs 2 (two) times daily.    No facility-administered encounter medications on file as of 07/26/2018.      Review of Systems  Review of Systems  Constitutional: Negative.  Negative for chills and fever.  HENT: Negative.   Respiratory: Negative for cough, shortness of breath and wheezing.   Cardiovascular: Negative.  Negative for chest pain, palpitations and leg swelling.  Gastrointestinal: Negative.   Allergic/Immunologic: Negative.   Neurological: Negative.   Psychiatric/Behavioral: Negative.        Physical Exam  BP 126/70 (BP Location: Left Arm, Patient Position: Sitting, Cuff Size:  Normal)   Pulse 85   Temp 98 F (36.7 C)   Ht 5' 3.5" (1.613 m)   Wt 144 lb (65.3 kg)   SpO2 100%   BMI 25.11 kg/m   Wt Readings from Last 5 Encounters:  07/26/18 144 lb (65.3 kg)  05/28/18 140 lb (63.5 kg)  03/21/18 148 lb 6.4 oz (67.3 kg)  04/04/14 132 lb (59.9 kg)  03/18/14 135 lb (61.2 kg)     Physical Exam Vitals signs and nursing note reviewed.  Constitutional:      General: She is not in acute distress.    Appearance: She is  well-developed.  Cardiovascular:     Rate and Rhythm: Normal rate and regular rhythm.  Pulmonary:     Effort: Pulmonary effort is normal. No respiratory distress.     Breath sounds: Normal breath sounds. No wheezing or rhonchi.  Musculoskeletal:        General: No swelling.  Neurological:     Mental Status: She is alert and oriented to person, place, and time.        Assessment & Plan:   Eosinophilic asthma ([place]) Patient is doing well.  She states that Dupixent injections have changed her life.  She no longer has any asthma symptoms.  She states that she quit all of her inhalers and prednisone.  Discussed that she should still continue Symbicort and albuterol as needed.  We did decrease the dosage of Symbicort to 80.  Discussed PFT in office today  Patient Instructions  Continue Albuterol as needed Continue Symbicort twice daily - will decrease dosage to 80 Continue dupixent injections   Follow up: Follow up with Dr. Vaughan Browner in 3 months or sooner if needed        Fenton Foy, NP 07/26/2018

## 2018-07-26 NOTE — Progress Notes (Signed)
PFT done today. 

## 2018-07-26 NOTE — Patient Instructions (Addendum)
Continue Albuterol as needed Continue Symbicort twice daily - will decrease dosage to 80 Continue dupixent injections   Follow up: Follow up with Dr. Vaughan Browner in 3 months or sooner if needed

## 2018-07-29 ENCOUNTER — Other Ambulatory Visit: Payer: Self-pay

## 2018-07-29 ENCOUNTER — Ambulatory Visit (INDEPENDENT_AMBULATORY_CARE_PROVIDER_SITE_OTHER): Payer: No Typology Code available for payment source

## 2018-07-29 ENCOUNTER — Ambulatory Visit: Payer: PRIVATE HEALTH INSURANCE | Admitting: Nurse Practitioner

## 2018-07-29 DIAGNOSIS — J4551 Severe persistent asthma with (acute) exacerbation: Secondary | ICD-10-CM | POA: Diagnosis not present

## 2018-07-29 MED ORDER — DUPILUMAB 300 MG/2ML ~~LOC~~ SOSY
300.0000 mg | PREFILLED_SYRINGE | Freq: Once | SUBCUTANEOUS | Status: AC
  Administered 2018-07-29: 17:00:00 300 mg via SUBCUTANEOUS

## 2018-07-29 NOTE — Progress Notes (Signed)
Have you been hospitalized within the last 10 days?  No Do you have a fever?  No Do you have a cough?  No Do you have a headache or sore throat? No  

## 2018-08-12 ENCOUNTER — Ambulatory Visit (INDEPENDENT_AMBULATORY_CARE_PROVIDER_SITE_OTHER): Payer: No Typology Code available for payment source

## 2018-08-12 ENCOUNTER — Other Ambulatory Visit: Payer: Self-pay

## 2018-08-12 DIAGNOSIS — J4551 Severe persistent asthma with (acute) exacerbation: Secondary | ICD-10-CM

## 2018-08-12 MED ORDER — DUPILUMAB 300 MG/2ML ~~LOC~~ SOSY
300.0000 mg | PREFILLED_SYRINGE | Freq: Once | SUBCUTANEOUS | Status: AC
  Administered 2018-08-12: 300 mg via SUBCUTANEOUS

## 2018-08-12 NOTE — Progress Notes (Signed)
Have you been hospitalized within the last 10 days?  No Do you have a fever?  No Do you have a cough?  No Do you have a headache or sore throat? No  

## 2018-08-14 ENCOUNTER — Telehealth: Payer: Self-pay | Admitting: Pulmonary Disease

## 2018-08-14 NOTE — Telephone Encounter (Signed)
Dupixent Order: 300mg  #1 prefilled syringe Ordered Date: 08/14/2018 Expected date of arrival: 08/21/2018 Ordered by: Desmond Dike, [place]: [facility]

## 2018-08-21 NOTE — Telephone Encounter (Signed)
Dupixent Shipment Received: 300mg  #2 prefilled syringe Medication arrival date: 08/21/18 Lot #: [id number] Exp date: 11/2020 Received by: Suzi Roots

## 2018-08-26 ENCOUNTER — Ambulatory Visit (INDEPENDENT_AMBULATORY_CARE_PROVIDER_SITE_OTHER): Payer: No Typology Code available for payment source

## 2018-08-26 ENCOUNTER — Other Ambulatory Visit: Payer: Self-pay

## 2018-08-26 DIAGNOSIS — J4551 Severe persistent asthma with (acute) exacerbation: Secondary | ICD-10-CM

## 2018-08-26 MED ORDER — DUPILUMAB 300 MG/2ML ~~LOC~~ SOSY
300.0000 mg | PREFILLED_SYRINGE | Freq: Once | SUBCUTANEOUS | Status: AC
  Administered 2018-08-26: 300 mg via SUBCUTANEOUS

## 2018-08-26 NOTE — Progress Notes (Signed)
All questions were answered by the patient before medication was administered. Have you been hospitalized in the last 10 days? No Do you have a fever? No Do you have a cough? No Do you have a headache or sore throat? No  Patient denies any problems from last injection.

## 2018-09-09 ENCOUNTER — Ambulatory Visit (INDEPENDENT_AMBULATORY_CARE_PROVIDER_SITE_OTHER): Payer: No Typology Code available for payment source

## 2018-09-09 ENCOUNTER — Other Ambulatory Visit: Payer: Self-pay

## 2018-09-09 DIAGNOSIS — J4551 Severe persistent asthma with (acute) exacerbation: Secondary | ICD-10-CM | POA: Diagnosis not present

## 2018-09-09 MED ORDER — DUPILUMAB 300 MG/2ML ~~LOC~~ SOSY
300.0000 mg | PREFILLED_SYRINGE | Freq: Once | SUBCUTANEOUS | Status: AC
  Administered 2018-09-09: 300 mg via SUBCUTANEOUS

## 2018-09-09 NOTE — Progress Notes (Signed)
Have you been hospitalized within the last 10 days?  no Do you have a fever?  No Do you have a cough?  No Do you have a headache or sore throat? No Do you have your Epi Pen visible and is it within date?  Yes

## 2018-09-16 ENCOUNTER — Telehealth: Payer: Self-pay | Admitting: Pulmonary Disease

## 2018-09-16 NOTE — Telephone Encounter (Signed)
Dupixent Order: 300mg  #2 prefilled syringe Ordered Date: 09/16/18 Expected date of arrival: 09/18/18 Ordered by: Tonna Corner Speciality Pharmacy: Lennette Bihari

## 2018-09-18 NOTE — Telephone Encounter (Signed)
Dupixent Shipment Received: 300mg  #2 prefilled syringe Medication arrival date: 09/18/18 Lot #: [id number] Exp date: 12/30/2020 Received by: Tonna Corner

## 2018-09-23 ENCOUNTER — Other Ambulatory Visit: Payer: Self-pay

## 2018-09-23 ENCOUNTER — Ambulatory Visit (INDEPENDENT_AMBULATORY_CARE_PROVIDER_SITE_OTHER): Payer: No Typology Code available for payment source

## 2018-09-23 DIAGNOSIS — J4551 Severe persistent asthma with (acute) exacerbation: Secondary | ICD-10-CM | POA: Diagnosis not present

## 2018-09-23 MED ORDER — DUPILUMAB 300 MG/2ML ~~LOC~~ SOSY
300.0000 mg | PREFILLED_SYRINGE | Freq: Once | SUBCUTANEOUS | Status: AC
  Administered 2018-09-23: 300 mg via SUBCUTANEOUS

## 2018-09-23 NOTE — Progress Notes (Signed)
All questions were answered by the patient before medication was administered. Have you been hospitalized in the last 10 days? No Do you have a fever? No Do you have a cough? No Do you have a headache or sore throat? No  

## 2018-10-08 ENCOUNTER — Telehealth: Payer: Self-pay | Admitting: Pulmonary Disease

## 2018-10-08 ENCOUNTER — Other Ambulatory Visit: Payer: Self-pay

## 2018-10-08 ENCOUNTER — Ambulatory Visit (INDEPENDENT_AMBULATORY_CARE_PROVIDER_SITE_OTHER): Payer: No Typology Code available for payment source

## 2018-10-08 DIAGNOSIS — Z23 Encounter for immunization: Secondary | ICD-10-CM | POA: Diagnosis not present

## 2018-10-08 DIAGNOSIS — J4551 Severe persistent asthma with (acute) exacerbation: Secondary | ICD-10-CM | POA: Diagnosis not present

## 2018-10-08 MED ORDER — DUPILUMAB 300 MG/2ML ~~LOC~~ SOSY
300.0000 mg | PREFILLED_SYRINGE | Freq: Once | SUBCUTANEOUS | Status: AC
  Administered 2018-10-08: 300 mg via SUBCUTANEOUS

## 2018-10-08 NOTE — Progress Notes (Signed)
All questions were answered by the patient before medication was administered. Have you been hospitalized in the last 10 days? No Do you have a fever? No Do you have a cough? No Do you have a headache or sore throat? No  

## 2018-10-08 NOTE — Telephone Encounter (Signed)
Spoke with pt. Advised her that she can get her flu shot along with her [place] today. Nothing further was needed.

## 2018-10-14 ENCOUNTER — Telehealth: Payer: Self-pay | Admitting: Pulmonary Disease

## 2018-10-14 NOTE — Telephone Encounter (Signed)
Dupixent Order: 300mg  #2 prefilled syringe Ordered Date: 10/14/2018 Expected date of arrival: 10/16/2018 Ordered by: Desmond Dike, [place]  Specialty Pharmacy: [facility]

## 2018-10-16 NOTE — Telephone Encounter (Signed)
Dupixent Shipment Received: 300mg  #2 prefilled syringe Medication arrival date: 10/16/2018 Lot #: [id number] Exp date: 01/30/2021 Received by: Elliot Dally

## 2018-10-22 ENCOUNTER — Ambulatory Visit (INDEPENDENT_AMBULATORY_CARE_PROVIDER_SITE_OTHER): Payer: No Typology Code available for payment source

## 2018-10-22 ENCOUNTER — Other Ambulatory Visit: Payer: Self-pay

## 2018-10-22 DIAGNOSIS — J4551 Severe persistent asthma with (acute) exacerbation: Secondary | ICD-10-CM

## 2018-10-22 MED ORDER — DUPILUMAB 300 MG/2ML ~~LOC~~ SOSY
300.0000 mg | PREFILLED_SYRINGE | Freq: Once | SUBCUTANEOUS | Status: AC
  Administered 2018-10-22: 300 mg via SUBCUTANEOUS

## 2018-10-22 NOTE — Progress Notes (Signed)
Have you been hospitalized within the last 10 days?  No Do you have a fever?  No Do you have a cough?  No Do you have a headache or sore throat? No Do you have your Epi Pen visible and is it within date?  Yes 

## 2018-11-05 ENCOUNTER — Ambulatory Visit (INDEPENDENT_AMBULATORY_CARE_PROVIDER_SITE_OTHER): Payer: No Typology Code available for payment source

## 2018-11-05 ENCOUNTER — Other Ambulatory Visit: Payer: Self-pay

## 2018-11-05 DIAGNOSIS — J4551 Severe persistent asthma with (acute) exacerbation: Secondary | ICD-10-CM

## 2018-11-05 MED ORDER — DUPILUMAB 300 MG/2ML ~~LOC~~ SOSY
300.0000 mg | PREFILLED_SYRINGE | Freq: Once | SUBCUTANEOUS | Status: AC
  Administered 2018-11-05: 300 mg via SUBCUTANEOUS

## 2018-11-05 NOTE — Progress Notes (Signed)
Have you been hospitalized within the last 10 days?  No Do you have a fever?  No Do you have a cough?  No Do you have a headache or sore throat? No Do you have your Epi Pen visible and is it within date?  Yes 

## 2018-11-06 ENCOUNTER — Telehealth: Payer: Self-pay | Admitting: Pulmonary Disease

## 2018-11-06 NOTE — Telephone Encounter (Signed)
Received a fax stating the pt's Dupixent PA has expired as of 10/31/2018. A renewal PA is needed. This has been started on Cover My Meds. Key: [id number] Will follow up.

## 2018-11-07 NOTE — Telephone Encounter (Signed)
Received fax from [facility]. PA has been approved 11/06/2018 - 11/06/2019.

## 2018-11-11 ENCOUNTER — Telehealth: Payer: Self-pay | Admitting: Pulmonary Disease

## 2018-11-11 NOTE — Telephone Encounter (Signed)
Dupixent Order: 300mg  #2 prefilled syringe Ordered Date: 11/11/18 Expected date of arrival: 11/13/18 Ordered by: [place]: [facility]  Order # [id number]

## 2018-11-13 NOTE — Telephone Encounter (Signed)
Dupixent Shipment Received: 300mg  #2 prefilled syringe Medication arrival date: 11/13/18 Lot #: [id number] Exp date: 04/30/2021 Received by: Elliot Dally

## 2018-11-18 ENCOUNTER — Telehealth: Payer: Self-pay | Admitting: Pulmonary Disease

## 2018-11-18 NOTE — Telephone Encounter (Signed)
Called and spoke with Patient.  Patient rescheduled 11/20/18 at 4:30pm. Nothing further at this time.

## 2018-11-19 ENCOUNTER — Other Ambulatory Visit: Payer: Self-pay

## 2018-11-19 ENCOUNTER — Telehealth: Payer: Self-pay | Admitting: Pulmonary Disease

## 2018-11-19 ENCOUNTER — Ambulatory Visit: Payer: No Typology Code available for payment source

## 2018-11-19 ENCOUNTER — Ambulatory Visit (INDEPENDENT_AMBULATORY_CARE_PROVIDER_SITE_OTHER): Payer: No Typology Code available for payment source

## 2018-11-19 DIAGNOSIS — J4551 Severe persistent asthma with (acute) exacerbation: Secondary | ICD-10-CM | POA: Diagnosis not present

## 2018-11-19 MED ORDER — DUPILUMAB 300 MG/2ML ~~LOC~~ SOSY
300.0000 mg | PREFILLED_SYRINGE | Freq: Once | SUBCUTANEOUS | Status: AC
  Administered 2018-11-19: 17:00:00 300 mg via SUBCUTANEOUS

## 2018-11-19 NOTE — Progress Notes (Signed)
All questions were answered by the patient before medication was administered. Have you been hospitalized in the last 10 days? No Do you have a fever? No Do you have a cough? No Do you have a headache or sore throat? No  

## 2018-11-19 NOTE — Telephone Encounter (Signed)
Spoke with pt. Her appointment has been moved to today at 1630. Nothing further was needed.

## 2018-11-20 ENCOUNTER — Ambulatory Visit: Payer: No Typology Code available for payment source

## 2018-11-27 ENCOUNTER — Ambulatory Visit: Payer: No Typology Code available for payment source | Admitting: Family Medicine

## 2018-11-28 ENCOUNTER — Other Ambulatory Visit: Payer: Self-pay | Admitting: Pulmonary Disease

## 2018-12-02 ENCOUNTER — Ambulatory Visit (INDEPENDENT_AMBULATORY_CARE_PROVIDER_SITE_OTHER): Payer: No Typology Code available for payment source | Admitting: Family Medicine

## 2018-12-02 ENCOUNTER — Ambulatory Visit: Payer: Self-pay

## 2018-12-02 ENCOUNTER — Other Ambulatory Visit: Payer: Self-pay

## 2018-12-02 ENCOUNTER — Encounter: Payer: Self-pay | Admitting: Family Medicine

## 2018-12-02 DIAGNOSIS — R768 Other specified abnormal immunological findings in serum: Secondary | ICD-10-CM | POA: Diagnosis not present

## 2018-12-02 DIAGNOSIS — M25512 Pain in left shoulder: Secondary | ICD-10-CM | POA: Diagnosis not present

## 2018-12-02 DIAGNOSIS — G8929 Other chronic pain: Secondary | ICD-10-CM | POA: Diagnosis not present

## 2018-12-02 MED ORDER — NABUMETONE 750 MG PO TABS: 750.0000 mg | ORAL_TABLET | Freq: Two times a day (BID) | ORAL | 6 refills | Status: DC | PRN

## 2018-12-02 NOTE — Progress Notes (Signed)
Office Visit Note   Patient: Tammie Sanders           Date of Birth: 1965/05/15           MRN: [id number] Visit Date: 12/02/2018 Requested by: [name], [place],  [place] [id number] PCP: [name], MD  Subjective: Chief Complaint  Patient presents with  . Left Shoulder - Pain    Pain in the shoulder x 6 months. Started in anterior shoulder, but moving to the posterior shoulder and down the arm. Tried taping, aleve, & TENS unit. Wakes from sleep.    HPI: She is here with left shoulder pain.  Symptoms started about 6 months ago, no injury.  Pain in the anterior aspect when reaching overhead or behind the back.  She has a complicated medical history with diagnosis of eosinophilic asthma a couple years ago after being exposed to mold in an apartment.  This is now under control with medication.  She had septic olecranon bursitis about a month prior to the onset of her left shoulder pain, this was treated with IV antibiotics in the hospital for a few days.  She recalls fracturing her left clavicle when she was a child, but did not have any pain in the shoulder until now.  She has a positive ANA recently which is thought to be related to her eosinophilic asthma.               ROS: No fevers or chills.  All other systems were reviewed and are negative.  Objective: Vital Signs: There were no vitals taken for this visit.  Physical Exam:  General:  Alert and oriented, in no acute distress. Pulm:  Breathing unlabored. Psy:  Normal mood, congruent affect. Skin: No rash. Left shoulder: Full range of motion, pain at the extreme of overhead reach and behind the back reach.  She has tenderness to palpation of the long head biceps tendon.  No Popeye deformity.  Isometric rotator cuff strength is 5/5 throughout, but she does have pain with empty can test and with speeds test.  No tenderness at the [facility] joint.  Imaging: Diagnostic ultrasound left shoulder: Long head biceps  tendon is located in its groove.  There is a slight amount of fluid in the tendon sheath but no hyperemia on power Doppler imaging.  Subscapularis tendon looks normal but there is a small amount of bursitis anterior to the tendon.  Supraspinatus has an interstitial partial tear but no full-thickness tear, no retraction.  Infraspinatus looks normal.   Assessment & Plan: 1.  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 -Discussed options with her, she wants to try a cortisone injection followed by home exercises.  We will treat with Relafen as needed.  Consider MRI scan if she fails to improve.     Procedures: Left shoulder ultrasound-guided long head biceps tendon injection: After sterile prep with Betadine, injected 3 cc 1% lidocaine without epinephrine and 40 mg methylprednisolone into the biceps tendon sheath at the area of maximum tenderness.  She had complete pain relief during the immediate anesthetic phase.    PMFS History: Patient Active Problem List   Diagnosis Date Noted  . Cellulitis 05/28/2018  . Olecranon bursitis, left elbow 05/28/2018  . Eosinophilic asthma 99991111  . Immunosuppressed status ([place]) 05/28/2018  . Partial symptomatic epilepsy with complex partial seizures, not intractable, without status epilepticus ([place]) 02/24/2014  . Nocturnal headaches 02/24/2014  . Chronic cough 07/12/2012  .  Breast mass in female 11/01/2010   Past Medical History:  Diagnosis Date  . Anal fissure   . Anxiety   . Asthma    Fall of 2014 [facility])   . Chronic headaches   . PE (pulmonary embolism) 1991   After C-section  . Pneumonia   . PONV (postoperative nausea and vomiting)   . 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 Sinusitis   . Skin cancer    basal cell  . Tuberculosis    tested positive to exposure; xray clear    Family History  Problem Relation Age of Onset  . Diabetes Father   . Cancer  Father        skin  . Arthritis Father   . Heart disease Father   . Asthma Mother   . Rheum arthritis Mother   . Arthritis Mother   . Heart disease Mother   . Other Mother        hypoglycemia  . Irritable bowel syndrome Daughter   . Cancer Maternal Grandfather        skin  . Melanoma Maternal Grandfather   . Colon cancer Neg Hx   . Esophageal cancer Neg Hx   . Rectal cancer Neg Hx   . Stomach cancer Neg Hx     Past Surgical History:  Procedure Laterality Date  . BASAL CELL CARCINOMA EXCISION  2008  . basal cell right chest    . BREAST LUMPECTOMY     breast  . [place]  . CLAVICLE SURGERY  2009   plate in right clavicle   . DILITATION & CURRETTAGE/HYSTROSCOPY WITH NOVASURE ABLATION N/A 07/25/2013   Procedure: DILATATION & CURETTAGE/HYSTEROSCOPY WITH NOVASURE ABLATION;  Surgeon: Marylynn Pearson, MD;  Location: [facility];  Service: Gynecology;  Laterality: N/A;  . LEEP N/A 07/25/2013   Procedure: LOOP ELECTROSURGICAL EXCISION PROCEDURE (LEEP);  Surgeon: Marylynn Pearson, MD;  Location: [facility];  Service: Gynecology;  Laterality: N/A;  . TEMPOROMANDIBULAR JOINT ARTHROPLASTY  2002   left    Social History   Occupational History  . Occupation: Therapist, sports: [employer]  Tobacco Use  . Smoking status: Former Smoker    Packs/day: 1.00    Years: 5.00    Pack years: 5.00    Types: Cigarettes    Quit date: 01/31/1988    Years since quitting: 30.8  . Smokeless tobacco: Never Used  Substance and Sexual Activity  . Alcohol use: Yes    Alcohol/week: 1.0 - 2.0 standard drinks    Types: 1 - 2 Standard drinks or equivalent per week    Comment: social  . Drug use: No  . Sexual activity: Not on file

## 2018-12-03 ENCOUNTER — Ambulatory Visit (INDEPENDENT_AMBULATORY_CARE_PROVIDER_SITE_OTHER): Payer: No Typology Code available for payment source

## 2018-12-03 DIAGNOSIS — J4551 Severe persistent asthma with (acute) exacerbation: Secondary | ICD-10-CM | POA: Diagnosis not present

## 2018-12-03 MED ORDER — DUPILUMAB 300 MG/2ML ~~LOC~~ SOSY
300.0000 mg | PREFILLED_SYRINGE | Freq: Once | SUBCUTANEOUS | Status: AC
  Administered 2018-12-03: 300 mg via SUBCUTANEOUS

## 2018-12-03 MED ORDER — TRAMADOL HCL 50 MG PO TABS: 50.0000 mg | ORAL_TABLET | Freq: Four times a day (QID) | ORAL | 0 refills | Status: DC | PRN

## 2018-12-03 MED ORDER — HYDROCODONE-ACETAMINOPHEN 5-325 MG PO TABS: 1.0000 | ORAL_TABLET | Freq: Every evening | ORAL | 0 refills | Status: DC | PRN

## 2018-12-03 NOTE — Addendum Note (Signed)
Addended by: Hortencia Pilar on: 12/03/2018 05:20 PM   Modules accepted: Orders

## 2018-12-03 NOTE — Telephone Encounter (Signed)
!  st, this message was put on top of a Dupixent refill request message - I do not think that part was meant to go to you. You only need to address the phone call message.

## 2018-12-03 NOTE — Telephone Encounter (Signed)
Tramadol Rx sent 

## 2018-12-03 NOTE — Progress Notes (Signed)
All questions were answered by the patient before medication was administered. Have you been hospitalized in the last 10 days? No Do you have a fever? No Do you have a cough? No Do you have a headache or sore throat? No  

## 2018-12-03 NOTE — Telephone Encounter (Signed)
Patient called advised her left shoulder is really hurting after the injection yesterday and want to know what can she do to ease the pain? The number to contact patient is [phone]

## 2018-12-03 NOTE — Telephone Encounter (Signed)
Rx sent 

## 2018-12-03 NOTE — Telephone Encounter (Signed)
I advised the patient of the Tramadol Rx. She has had this in the past and it made her very shaky. While in the hospital with her elbow infection, they gave her low dose hydrocodone. This worked well without problem. She said she just needs something to dull the pain at night so she can sleep (only slept about 1 hour).

## 2018-12-04 ENCOUNTER — Other Ambulatory Visit: Payer: Self-pay

## 2018-12-04 DIAGNOSIS — Z20822 Contact with and (suspected) exposure to covid-19: Secondary | ICD-10-CM

## 2018-12-04 NOTE — Telephone Encounter (Signed)
I called and advised the patient of the hydrocodone Rx that was sent in to her pharmacy.

## 2018-12-05 LAB — NOVEL CORONAVIRUS, NAA: SARS-CoV-2, NAA: NOT DETECTED

## 2018-12-10 ENCOUNTER — Telehealth: Payer: Self-pay | Admitting: Pulmonary Disease

## 2018-12-10 NOTE — Telephone Encounter (Signed)
Dupixent Order: 300mg  #2 prefilled syringe Ordered Date: 12/10/2018 Expected date of arrival: 12/13/2018 Ordered by: Desmond Dike, [place]  Specialty Pharmacy: [facility]

## 2018-12-13 NOTE — Telephone Encounter (Signed)
Dupixent Shipment Received: 300mg  #2 prefilled syringe Medication arrival date: 12/13/2018 Lot #: [id number] Exp date: 04/2021 Received by: Desmond Dike, [place]

## 2018-12-17 ENCOUNTER — Ambulatory Visit (INDEPENDENT_AMBULATORY_CARE_PROVIDER_SITE_OTHER): Payer: No Typology Code available for payment source

## 2018-12-17 ENCOUNTER — Other Ambulatory Visit: Payer: Self-pay

## 2018-12-17 DIAGNOSIS — J4551 Severe persistent asthma with (acute) exacerbation: Secondary | ICD-10-CM

## 2018-12-17 MED ORDER — DUPILUMAB 300 MG/2ML ~~LOC~~ SOSY
300.0000 mg | PREFILLED_SYRINGE | Freq: Once | SUBCUTANEOUS | Status: AC
  Administered 2018-12-17: 17:00:00 300 mg via SUBCUTANEOUS

## 2018-12-17 NOTE — Progress Notes (Signed)
Have you been hospitalized within the last 10 days?  No Do you have a fever?  No Do you have a cough?  No Do you have a headache or sore throat? No Do you have your Epi Pen visible and is it within date?  Yes 

## 2018-12-31 ENCOUNTER — Other Ambulatory Visit: Payer: Self-pay

## 2018-12-31 ENCOUNTER — Ambulatory Visit (INDEPENDENT_AMBULATORY_CARE_PROVIDER_SITE_OTHER): Payer: No Typology Code available for payment source

## 2018-12-31 DIAGNOSIS — J4551 Severe persistent asthma with (acute) exacerbation: Secondary | ICD-10-CM

## 2018-12-31 MED ORDER — DUPILUMAB 300 MG/2ML ~~LOC~~ SOSY
300.0000 mg | PREFILLED_SYRINGE | Freq: Once | SUBCUTANEOUS | Status: AC
  Administered 2018-12-31: 300 mg via SUBCUTANEOUS

## 2018-12-31 NOTE — Progress Notes (Signed)
Have you been hospitalized within the last 10 days?  No Do you have a fever?  No Do you have a cough?  No Do you have a headache or sore throat? No Do you have your Epi Pen visible and is it within date?  Yes 

## 2019-01-07 ENCOUNTER — Other Ambulatory Visit: Payer: Self-pay | Admitting: *Deleted

## 2019-01-07 ENCOUNTER — Telehealth: Payer: Self-pay | Admitting: Pulmonary Disease

## 2019-01-07 MED ORDER — DUPIXENT 300 MG/2ML ~~LOC~~ SOSY: PREFILLED_SYRINGE | SUBCUTANEOUS | 11 refills | Status: DC

## 2019-01-07 NOTE — Telephone Encounter (Signed)
Dupixent Order: 300mg  #2 prefilled syringe Ordered Date: 01/07/19 Expected date of arrival: 01/09/19 Ordered by: [place]: [facility] Bihari

## 2019-01-09 NOTE — Telephone Encounter (Signed)
Dupixent Shipment Received: 300mg  #2 prefilled syringe Medication arrival date: 01/09/19 Lot #: [id number] Exp date: 05/30/2021 Received by: Elliot Dally

## 2019-01-14 ENCOUNTER — Ambulatory Visit (INDEPENDENT_AMBULATORY_CARE_PROVIDER_SITE_OTHER): Payer: No Typology Code available for payment source

## 2019-01-14 ENCOUNTER — Other Ambulatory Visit: Payer: Self-pay

## 2019-01-14 DIAGNOSIS — J4551 Severe persistent asthma with (acute) exacerbation: Secondary | ICD-10-CM

## 2019-01-14 MED ORDER — DUPILUMAB 300 MG/2ML ~~LOC~~ SOSY
300.0000 mg | PREFILLED_SYRINGE | Freq: Once | SUBCUTANEOUS | Status: AC
  Administered 2019-01-14: 17:00:00 300 mg via SUBCUTANEOUS

## 2019-01-14 NOTE — Progress Notes (Signed)
All questions were answered by the patient before medication was administered. Have you been hospitalized in the last 10 days? No Do you have a fever? No Do you have a cough? No Do you have a headache or sore throat? No  

## 2019-01-28 ENCOUNTER — Ambulatory Visit (INDEPENDENT_AMBULATORY_CARE_PROVIDER_SITE_OTHER): Payer: No Typology Code available for payment source

## 2019-01-28 ENCOUNTER — Other Ambulatory Visit: Payer: Self-pay

## 2019-01-28 DIAGNOSIS — J4551 Severe persistent asthma with (acute) exacerbation: Secondary | ICD-10-CM

## 2019-01-28 MED ORDER — DUPILUMAB 300 MG/2ML ~~LOC~~ SOSY
300.0000 mg | PREFILLED_SYRINGE | Freq: Once | SUBCUTANEOUS | Status: AC
  Administered 2019-01-28: 300 mg via SUBCUTANEOUS

## 2019-01-28 NOTE — Progress Notes (Signed)
All questions were answered by the patient before medication was administered. Have you been hospitalized in the last 10 days? No Do you have a fever? No Do you have a cough? No Do you have a headache or sore throat? No  

## 2019-02-04 ENCOUNTER — Telehealth: Payer: Self-pay | Admitting: Pulmonary Disease

## 2019-02-04 NOTE — Telephone Encounter (Signed)
Dupixent Order: 300mg  #2 prefilled syringe Ordered Date: 02/04/2019 Expected date of arrival: 02/07/2019 Ordered by: Desmond Dike, [place]  Specialty Pharmacy: [facility]

## 2019-02-10 NOTE — Telephone Encounter (Signed)
Patient called -states that she called [facility] to give authorization, but we need to call them to authorize delivery - we need to call back by 2pm Central time or 3pm our time in order for delivery today -pr

## 2019-02-10 NOTE — Telephone Encounter (Signed)
Called [facility].  Patient Dupixent not received as scheduled.  Per Rep. Patient consent is needed.  Patient is scheduled 02/11/19 at 1630. Patient made aware of consent needed. Will follow up.

## 2019-02-10 NOTE — Telephone Encounter (Signed)
Called [facility]. Dupixent scheduled for 02/11/2019 morning delivery.

## 2019-02-11 ENCOUNTER — Ambulatory Visit (INDEPENDENT_AMBULATORY_CARE_PROVIDER_SITE_OTHER): Payer: No Typology Code available for payment source

## 2019-02-11 ENCOUNTER — Other Ambulatory Visit: Payer: Self-pay

## 2019-02-11 DIAGNOSIS — J4551 Severe persistent asthma with (acute) exacerbation: Secondary | ICD-10-CM | POA: Diagnosis not present

## 2019-02-11 MED ORDER — DUPILUMAB 300 MG/2ML ~~LOC~~ SOSY
300.0000 mg | PREFILLED_SYRINGE | Freq: Once | SUBCUTANEOUS | Status: AC
  Administered 2019-02-11: 17:00:00 300 mg via SUBCUTANEOUS

## 2019-02-11 NOTE — Telephone Encounter (Signed)
Dupixent Shipment Received: 300mg  #2 prefilled syringe Medication arrival date: 02/11/19 Lot #: [id number] Exp date: 05/30/2021 Received by: Elliot Dally

## 2019-02-11 NOTE — Progress Notes (Signed)
Have you been hospitalized within the last 10 days?  No Do you have a fever?  No Do you have a cough?  No Do you have a headache or sore throat? No Do you have your Epi Pen visible and is it within date?  Yes 

## 2019-02-25 ENCOUNTER — Ambulatory Visit (INDEPENDENT_AMBULATORY_CARE_PROVIDER_SITE_OTHER): Payer: No Typology Code available for payment source

## 2019-02-25 ENCOUNTER — Other Ambulatory Visit: Payer: Self-pay

## 2019-02-25 DIAGNOSIS — J4551 Severe persistent asthma with (acute) exacerbation: Secondary | ICD-10-CM | POA: Diagnosis not present

## 2019-02-25 MED ORDER — DUPILUMAB 300 MG/2ML ~~LOC~~ SOSY
300.0000 mg | PREFILLED_SYRINGE | Freq: Once | SUBCUTANEOUS | Status: AC
  Administered 2019-02-25: 300 mg via SUBCUTANEOUS

## 2019-02-25 NOTE — Progress Notes (Signed)
All questions were answered by the patient before medication was administered. Have you been hospitalized in the last 10 days? No Do you have a fever? No Do you have a cough? No Do you have a headache or sore throat? No  

## 2019-03-03 ENCOUNTER — Telehealth: Payer: Self-pay | Admitting: Pulmonary Disease

## 2019-03-03 NOTE — Telephone Encounter (Signed)
Dupixent Order: 300mg  #2 prefilled syringe Ordered Date: 03/03/19 Expected date of arrival: 03/05/19 Ordered by: [place]: [facility]

## 2019-03-05 NOTE — Telephone Encounter (Signed)
Dupixent Shipment Received: 300mg  #2 prefilled syringe Medication arrival date: 03/05/19 Lot #: [id number] Exp date: 05/30/2021 Received by: Elliot Dally

## 2019-03-11 ENCOUNTER — Other Ambulatory Visit: Payer: Self-pay

## 2019-03-11 ENCOUNTER — Ambulatory Visit (INDEPENDENT_AMBULATORY_CARE_PROVIDER_SITE_OTHER): Payer: No Typology Code available for payment source

## 2019-03-11 DIAGNOSIS — J4551 Severe persistent asthma with (acute) exacerbation: Secondary | ICD-10-CM | POA: Diagnosis not present

## 2019-03-11 MED ORDER — DUPILUMAB 300 MG/2ML ~~LOC~~ SOSY
300.0000 mg | PREFILLED_SYRINGE | Freq: Once | SUBCUTANEOUS | Status: AC
  Administered 2019-03-11: 300 mg via SUBCUTANEOUS

## 2019-03-11 NOTE — Progress Notes (Signed)
Have you been hospitalized within the last 10 days?  No Do you have a fever?  No Do you have a cough?  No Do you have a headache or sore throat? No  

## 2019-03-25 ENCOUNTER — Ambulatory Visit (INDEPENDENT_AMBULATORY_CARE_PROVIDER_SITE_OTHER): Payer: No Typology Code available for payment source

## 2019-03-25 ENCOUNTER — Other Ambulatory Visit: Payer: Self-pay

## 2019-03-25 DIAGNOSIS — J4551 Severe persistent asthma with (acute) exacerbation: Secondary | ICD-10-CM | POA: Diagnosis not present

## 2019-03-25 MED ORDER — DUPILUMAB 300 MG/2ML ~~LOC~~ SOSY
300.0000 mg | PREFILLED_SYRINGE | Freq: Once | SUBCUTANEOUS | Status: AC
  Administered 2019-03-25: 300 mg via SUBCUTANEOUS

## 2019-03-25 NOTE — Progress Notes (Signed)
All questions were answered by the patient before medication was administered. Have you been hospitalized in the last 10 days? No Do you have a fever? No Do you have a cough? No Do you have a headache or sore throat? No  

## 2019-03-31 ENCOUNTER — Telehealth: Payer: Self-pay | Admitting: Pulmonary Disease

## 2019-03-31 NOTE — Telephone Encounter (Signed)
Dupixent Order: 300mg  #2 prefilled syringe Ordered Date: 03/31/2019 Expected date of arrival: 04/03/2019 Ordered by: Desmond Dike, [place]  Specialty Pharmacy: [facility]

## 2019-04-03 NOTE — Telephone Encounter (Signed)
Dupixent Shipment Received: 300mg  #2 prefilled syringe Medication arrival date: 04/03/2019 Lot #: [id number] Exp date: 06/2021 Received by: Desmond Dike, [place]

## 2019-04-08 ENCOUNTER — Ambulatory Visit (INDEPENDENT_AMBULATORY_CARE_PROVIDER_SITE_OTHER): Payer: No Typology Code available for payment source

## 2019-04-08 ENCOUNTER — Other Ambulatory Visit: Payer: Self-pay

## 2019-04-08 DIAGNOSIS — J4551 Severe persistent asthma with (acute) exacerbation: Secondary | ICD-10-CM | POA: Diagnosis not present

## 2019-04-08 MED ORDER — DUPILUMAB 300 MG/2ML ~~LOC~~ SOSY
300.0000 mg | PREFILLED_SYRINGE | Freq: Once | SUBCUTANEOUS | Status: AC
  Administered 2019-04-08: 300 mg via SUBCUTANEOUS

## 2019-04-08 NOTE — Progress Notes (Signed)
All questions were answered by the patient before medication was administered. Have you been hospitalized in the last 10 days? No Do you have a fever? No Do you have a cough? No Do you have a headache or sore throat? No  

## 2019-04-22 ENCOUNTER — Ambulatory Visit (INDEPENDENT_AMBULATORY_CARE_PROVIDER_SITE_OTHER): Payer: No Typology Code available for payment source

## 2019-04-22 ENCOUNTER — Other Ambulatory Visit: Payer: Self-pay

## 2019-04-22 DIAGNOSIS — J4551 Severe persistent asthma with (acute) exacerbation: Secondary | ICD-10-CM | POA: Diagnosis not present

## 2019-04-22 MED ORDER — DUPILUMAB 300 MG/2ML ~~LOC~~ SOSY
300.0000 mg | PREFILLED_SYRINGE | Freq: Once | SUBCUTANEOUS | Status: AC
  Administered 2019-04-22: 300 mg via SUBCUTANEOUS

## 2019-04-22 NOTE — Progress Notes (Signed)
Have you been hospitalized within the last 10 days?  No Do you have a fever?  No Do you have a cough?  No Do you have a headache or sore throat? No Do you have your Epi Pen visible and is it within date?  Yes 

## 2019-04-28 ENCOUNTER — Telehealth: Payer: Self-pay | Admitting: Pulmonary Disease

## 2019-04-28 NOTE — Telephone Encounter (Signed)
Dupixent Order: 300mg  #2 prefilled syringe Ordered Date: 04/28/2019 Expected date of arrival: 05/01/2019 Ordered by: Desmond Dike, [place]  Specialty Pharmacy: [facility]

## 2019-05-01 NOTE — Telephone Encounter (Signed)
Dupixent Shipment Received: 300mg  #2 prefilled syringe Medication arrival date: 05/01/2019 Lot #: [id number] Exp date: 08/2021 Received by: Desmond Dike, CMA

## 2019-05-06 ENCOUNTER — Ambulatory Visit (INDEPENDENT_AMBULATORY_CARE_PROVIDER_SITE_OTHER): Payer: No Typology Code available for payment source

## 2019-05-06 ENCOUNTER — Other Ambulatory Visit: Payer: Self-pay

## 2019-05-06 DIAGNOSIS — J4551 Severe persistent asthma with (acute) exacerbation: Secondary | ICD-10-CM | POA: Diagnosis not present

## 2019-05-06 MED ORDER — DUPILUMAB 300 MG/2ML ~~LOC~~ SOSY
300.0000 mg | PREFILLED_SYRINGE | Freq: Once | SUBCUTANEOUS | Status: AC
  Administered 2019-05-06: 300 mg via SUBCUTANEOUS

## 2019-05-06 NOTE — Progress Notes (Signed)
All questions were answered by the patient before medication was administered. Have you been hospitalized in the last 10 days? No Do you have a fever? No Do you have a cough? No Do you have a headache or sore throat? No  

## 2019-05-20 ENCOUNTER — Ambulatory Visit (INDEPENDENT_AMBULATORY_CARE_PROVIDER_SITE_OTHER): Payer: No Typology Code available for payment source

## 2019-05-20 ENCOUNTER — Other Ambulatory Visit: Payer: Self-pay

## 2019-05-20 DIAGNOSIS — J4551 Severe persistent asthma with (acute) exacerbation: Secondary | ICD-10-CM | POA: Diagnosis not present

## 2019-05-20 MED ORDER — DUPILUMAB 300 MG/2ML ~~LOC~~ SOSY
300.0000 mg | PREFILLED_SYRINGE | Freq: Once | SUBCUTANEOUS | Status: AC
  Administered 2019-05-20: 300 mg via SUBCUTANEOUS

## 2019-05-20 NOTE — Progress Notes (Signed)
Have you been hospitalized within the last 10 days?  No Do you have a fever?  No Do you have a cough?  No Do you have a headache or sore throat? No Do you have your Epi Pen visible and is it within date?  Yes 

## 2019-05-27 ENCOUNTER — Telehealth: Payer: Self-pay | Admitting: Pulmonary Disease

## 2019-05-27 NOTE — Telephone Encounter (Signed)
Dupixent Order: 300mg  #2 prefilled syringe Ordered Date: 05/27/2019 Expected date of arrival: 05/29/2019 Ordered by: Desmond Dike, [place]  Specialty Pharmacy: [facility]

## 2019-05-29 NOTE — Telephone Encounter (Signed)
Dupixent Shipment Received: 300mg  #2 prefilled syringe Medication arrival date: 05/29/2019 Lot #: [id number] Exp date: 07/2021 Received by: Desmond Dike, [place]

## 2019-06-02 ENCOUNTER — Telehealth: Payer: Self-pay | Admitting: Pulmonary Disease

## 2019-06-03 ENCOUNTER — Ambulatory Visit: Payer: No Typology Code available for payment source

## 2019-06-03 NOTE — Telephone Encounter (Signed)
Spoke with pt. She has been scheduled for her injection on 06/05/19 at 0900. Nothing further needed.

## 2019-06-05 ENCOUNTER — Other Ambulatory Visit: Payer: Self-pay

## 2019-06-05 ENCOUNTER — Ambulatory Visit (INDEPENDENT_AMBULATORY_CARE_PROVIDER_SITE_OTHER): Payer: No Typology Code available for payment source

## 2019-06-05 DIAGNOSIS — J4551 Severe persistent asthma with (acute) exacerbation: Secondary | ICD-10-CM | POA: Diagnosis not present

## 2019-06-05 MED ORDER — DUPILUMAB 300 MG/2ML ~~LOC~~ SOSY
300.0000 mg | PREFILLED_SYRINGE | Freq: Once | SUBCUTANEOUS | Status: AC
  Administered 2019-06-05: 300 mg via SUBCUTANEOUS

## 2019-06-05 NOTE — Progress Notes (Signed)
All questions were answered by the patient before medication was administered. Have you been hospitalized in the last 10 days? No Do you have a fever? No Do you have a cough? No Do you have a headache or sore throat? No  

## 2019-06-19 ENCOUNTER — Other Ambulatory Visit: Payer: Self-pay

## 2019-06-19 ENCOUNTER — Ambulatory Visit (INDEPENDENT_AMBULATORY_CARE_PROVIDER_SITE_OTHER): Payer: No Typology Code available for payment source

## 2019-06-19 DIAGNOSIS — J4551 Severe persistent asthma with (acute) exacerbation: Secondary | ICD-10-CM

## 2019-06-19 MED ORDER — DUPILUMAB 300 MG/2ML ~~LOC~~ SOSY
300.0000 mg | PREFILLED_SYRINGE | Freq: Once | SUBCUTANEOUS | Status: AC
  Administered 2019-06-19: 300 mg via SUBCUTANEOUS

## 2019-06-19 NOTE — Progress Notes (Signed)
All questions were answered by the patient before medication was administered. Have you been hospitalized in the last 10 days? No Do you have a fever? No Do you have a cough? No Do you have a headache or sore throat? No  

## 2019-06-23 ENCOUNTER — Telehealth: Payer: Self-pay | Admitting: Pulmonary Disease

## 2019-06-23 NOTE — Telephone Encounter (Signed)
Dupixent Order: 300mg  #2 prefilled syringe Ordered Date: 06/23/2019 Expected date of arrival: 06/25/2019 Ordered by: Desmond Dike, [place]  Specialty Pharmacy: [facility]

## 2019-06-25 NOTE — Telephone Encounter (Signed)
Dupixent Shipment Received: 300mg  #2 prefilled syringe Medication arrival date: 06/25/2019 Lot #: [id number] Exp date: 09/2021 Received by: Desmond Dike, [place]

## 2019-07-03 ENCOUNTER — Ambulatory Visit: Payer: No Typology Code available for payment source

## 2019-07-04 ENCOUNTER — Ambulatory Visit: Payer: No Typology Code available for payment source

## 2019-07-07 ENCOUNTER — Other Ambulatory Visit: Payer: Self-pay

## 2019-07-07 ENCOUNTER — Ambulatory Visit (INDEPENDENT_AMBULATORY_CARE_PROVIDER_SITE_OTHER): Payer: No Typology Code available for payment source

## 2019-07-07 DIAGNOSIS — J4551 Severe persistent asthma with (acute) exacerbation: Secondary | ICD-10-CM

## 2019-07-07 MED ORDER — DUPILUMAB 300 MG/2ML ~~LOC~~ SOSY
300.0000 mg | PREFILLED_SYRINGE | Freq: Once | SUBCUTANEOUS | Status: AC
Start: 2019-07-07 — End: 2019-07-07
  Administered 2019-07-07: 300 mg via SUBCUTANEOUS

## 2019-07-07 NOTE — Progress Notes (Signed)
All questions were answered by the patient before medication was administered. Have you been hospitalized in the last 10 days? No Do you have a fever? No Do you have a cough? No Do you have a headache or sore throat? No  

## 2019-07-22 ENCOUNTER — Ambulatory Visit (INDEPENDENT_AMBULATORY_CARE_PROVIDER_SITE_OTHER): Payer: No Typology Code available for payment source

## 2019-07-22 ENCOUNTER — Other Ambulatory Visit: Payer: Self-pay

## 2019-07-22 ENCOUNTER — Telehealth: Payer: Self-pay | Admitting: Pulmonary Disease

## 2019-07-22 DIAGNOSIS — J4551 Severe persistent asthma with (acute) exacerbation: Secondary | ICD-10-CM

## 2019-07-22 MED ORDER — DUPILUMAB 300 MG/2ML ~~LOC~~ SOSY
300.0000 mg | PREFILLED_SYRINGE | Freq: Once | SUBCUTANEOUS | Status: AC
  Administered 2019-07-22: 300 mg via SUBCUTANEOUS

## 2019-07-22 NOTE — Telephone Encounter (Signed)
Pt came in for her Dupixent injection today. She interested in starting to do home injections as she willing be helping care for her mother in law in the coming months.  Dr. Vaughan Browner - please advise if pt would be a good candidate for this. Thanks.

## 2019-07-22 NOTE — Progress Notes (Signed)
All questions were answered by the patient before medication was administered. Have you been hospitalized in the last 10 days? No Do you have a fever? No Do you have a cough? No Do you have a headache or sore throat? No  

## 2019-07-23 MED ORDER — DUPIXENT 300 MG/2ML ~~LOC~~ SOAJ: 300.0000 mg | SUBCUTANEOUS | 11 refills | Status: DC

## 2019-07-23 NOTE — Telephone Encounter (Signed)
Rx has been sent in for Dupixent pen.

## 2019-07-23 NOTE — Telephone Encounter (Signed)
Yes. She can transition to home injections

## 2019-07-23 NOTE — Telephone Encounter (Signed)
Submitted a Prior Authorization request to [facility] for [place] via Cover My Meds. Will update once we receive a response.    (KeyLin Landsman) - [id number]

## 2019-07-25 NOTE — Telephone Encounter (Signed)
Received response form insurance that no new prior authorization is needed. Patient has a current PA that covers both pen and syringe. Expires 11/06/2019.  ON#[id number]

## 2019-07-28 ENCOUNTER — Telehealth: Payer: Self-pay | Admitting: Pulmonary Disease

## 2019-07-28 NOTE — Telephone Encounter (Signed)
Called [facility] to set up shipment of pt's Dupixent pen. Was advised that insurance is requiring a PA for the pens. Per documentation, PA is not needed. I gave this information to the pharmacy but it was still getting a rejected claim.

## 2019-07-29 NOTE — Telephone Encounter (Signed)
Called [facility], spoke to rep [place]and[place], requested claim to be rerun and she was able to get a paid claim while I was on the phone. Rep states they will need to obtain a new patient consent before they can ship 1st new prescription to the office. She scheduled for shipment to deliver to office on 08/07/19. Advised they will contact the office with any delays.  Phone# [phone]

## 2019-07-29 NOTE — Telephone Encounter (Signed)
Spoke with pt. She is going to call the pharmacy to have them ship the medication to her instead. We will use a sample for the pt so she does not get off schedule with her injections.

## 2019-07-31 ENCOUNTER — Telehealth: Payer: Self-pay | Admitting: Pulmonary Disease

## 2019-07-31 NOTE — Telephone Encounter (Signed)
Spoke with pt. She has been scheduled on 08/05/2019 at 1630. Pt will be the last pt of the day as we have to train her on how to use the Dupxient pen.

## 2019-08-05 ENCOUNTER — Other Ambulatory Visit: Payer: Self-pay

## 2019-08-05 ENCOUNTER — Ambulatory Visit (INDEPENDENT_AMBULATORY_CARE_PROVIDER_SITE_OTHER): Payer: No Typology Code available for payment source

## 2019-08-05 DIAGNOSIS — J4551 Severe persistent asthma with (acute) exacerbation: Secondary | ICD-10-CM | POA: Diagnosis not present

## 2019-08-05 MED ORDER — DUPILUMAB 300 MG/2ML ~~LOC~~ SOSY
300.0000 mg | PREFILLED_SYRINGE | Freq: Once | SUBCUTANEOUS | Status: AC
Start: 2019-08-05 — End: 2019-08-05
  Administered 2019-08-05: 300 mg via SUBCUTANEOUS

## 2019-08-05 NOTE — Progress Notes (Signed)
Have you been hospitalized within the last 10 days?  No Do you have a fever?  No Do you have a cough?  No Do you have a headache or sore throat? No Do you have your Epi Pen visible and is it within date?  Yes   Patient instructed on how to self administer Dupixent single dose pen with demo pen. Patient demonstrated injection with demo pen.   Patient self injected Dupixent.  Patient had no issues, or complaints.

## 2019-08-06 ENCOUNTER — Ambulatory Visit: Payer: No Typology Code available for payment source

## 2019-08-28 ENCOUNTER — Ambulatory Visit (INDEPENDENT_AMBULATORY_CARE_PROVIDER_SITE_OTHER): Payer: No Typology Code available for payment source | Admitting: Pulmonary Disease

## 2019-08-28 ENCOUNTER — Encounter: Payer: Self-pay | Admitting: Pulmonary Disease

## 2019-08-28 ENCOUNTER — Other Ambulatory Visit: Payer: Self-pay

## 2019-08-28 DIAGNOSIS — J4551 Severe persistent asthma with (acute) exacerbation: Secondary | ICD-10-CM | POA: Diagnosis not present

## 2019-08-28 NOTE — Progress Notes (Addendum)
Tammie Sanders    [id number]    12-Mar-1965  Primary Care Physician:Miller, Lattie Haw, MD  Referring Physician: [name], MD [place],  [place] [address]  Virtual Visit via Telephone Note  I connected with Lorey Pallett on 08/28/19 at  2:15 PM EDT by telephone and verified that I am speaking with the correct person using two identifiers.  Location: Patient: Home Provider: [facility], [place], [place]   I discussed the limitations, risks, security and privacy concerns of performing an evaluation and management service by telephone and the availability of in person appointments. I also discussed with the patient that there may be a patient responsible charge related to this service. The patient expressed understanding and agreed to proceed.   Problem list: Severe persistent asthma Positive ANA.  No evidence of ILD on HRCT in 1160  HPI: 54 year old with history of asthma, PE, seizures Previously evaluated in the pulmonary clinic around 2014 for cough which was felt to be upper respiratory.  She is evaluated for allergies with negative RAST panel and negative IgE  Had significant mold exposure in fall 2014.  Since then she has had ongoing issues with cough, shortness of breath, chest tightness, wheezing.  The only thing that is worked in the past is prednisone She is been tried on multiple inhalers including Symbicort, Spiriva, Singulair.  Evaluated by Dr. Fredderick Phenix at allergy.  Allergy testing showed sensitivity to only mold   She also has a positive ANA in 2020.  Evaluated by Dr. 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  No ongoing exposure.  No mold, hot tub, Jacuzzi Smoking history: 5-pack-year smoker.  Quit in 1990. Travel history: No significant  travel history Relevant family history: Mother has asthma, rheumatoid arthritis and lupus.  Interval history: Started on Dupixent in March 2020 with remarkable improvement in symptoms She states that her life is back to normal now and the therapy is a Sports administrator  She has in fact stopped all inhalers, Singulair.  We discussed it today in office and I have asked her to resume the inhaler medication.  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 Trudie Reed again to get another reeval of rheumatologic conditions  Outpatient Encounter Medications as of 08/28/2019  Medication Sig  . ADDERALL XR 20 MG 24 hr capsule TK 1 C PO QD  . albuterol (PROVENTIL HFA;VENTOLIN HFA) 108 (90 Base) MCG/ACT inhaler Inhale 2 puffs into the lungs every 6 (six) hours as needed for wheezing or shortness of breath.   . Dupilumab (DUPIXENT) 300 MG/2ML SOPN Inject 300 mg into the skin every 14 (fourteen) days.  Marland Kitchen EPINEPHrine 0.3 mg/0.3 mL IJ SOAJ injection Inject 0.3 mLs (0.3 mg total) into the muscle once.  Marland Kitchen FLUoxetine (PROZAC) 40 MG capsule Take 40 mg by mouth daily.  Marland Kitchen ibuprofen (ADVIL) 200 MG tablet Take 200 mg by mouth every 6 (six) hours as needed for headache or mild pain.  . metoprolol succinate (TOPROL-XL) 25 MG 24 hr tablet Take 25 mg by mouth daily.  . nabumetone (RELAFEN) 750 MG tablet Take 1 tablet (750 mg total) by mouth 2 (two) times daily as needed.  Marland Kitchen oxybutynin (DITROPAN-XL) 10 MG 24  hr tablet Take 10 mg by mouth daily.   . [DISCONTINUED] HYDROcodone-acetaminophen (NORCO/VICODIN) 5-325 MG tablet Take 1 tablet by mouth at bedtime as needed for moderate pain.  . [DISCONTINUED] traMADol (ULTRAM) 50 MG tablet Take 1-2 tablets (50-100 mg total) by mouth every 6 (six) hours as needed.  . montelukast (SINGULAIR) 10 MG tablet Take 10 mg by mouth at bedtime. (Patient not taking: Reported on 08/28/2019)  . SPIRIVA RESPIMAT 1.25 MCG/ACT AERS Inhale 2 puffs into the lungs daily.   (Patient not taking: Reported on 08/28/2019)  . SYMBICORT 160-4.5 MCG/ACT inhaler Inhale 2 puffs into the lungs 2 (two) times daily.  (Patient not taking: Reported on 08/28/2019)  . [DISCONTINUED] budesonide-formoterol (SYMBICORT) 80-4.5 MCG/ACT inhaler Inhale 2 puffs into the lungs 2 (two) times a day. (Patient not taking: Reported on 08/28/2019)   No facility-administered encounter medications on file as of 08/28/2019.   Physical Exam: Tele  Data Reviewed: Imaging: Chest x-ray 07/24/2012-no acute cardiopulmonary abnormality. HRCT 04/11/2018-normal test with no ILD I have reviewed the images personally  PFTs: 07/26/2018 FVC 3.32 [97%], FEV1 2.64 [98%], F/F 80, TLC 5.34 [107%], DLCO 23.82 [116%] Minimal obstructive airway disease with bronchodilator response  FENO 03/21/2018-103  Labs: CBC 05/28/2018-WBC 19.5, eos 1%, absolute eosinophil count 194 IgE 03/21/2018-40  ANA 03/21/2018-1:320, nuclear  Assessment:  Severe persistent asthma Improved with Dupixent She will resume the Symbicort Check pulmonary function test for evaluation  Positive ANA She is planning to follow back with Dr. Trudie Reed for evaluation Previous HRCT with no ILD  Plan/Recommendations: - Continue Dupixent - Resume Symbicort - PFTs in 3 months  I discussed the assessment and treatment plan with the patient. The patient was provided an opportunity to ask questions and all were answered. The patient agreed with the plan and demonstrated an understanding of the instructions.   The patient was advised to call back or seek an in-person evaluation if the symptoms worsen or if the condition fails to improve as anticipated.  I provided 25 minutes of non-face-to-face time during this encounter.  Marshell Garfinkel MD [facility] 08/28/2019, 2:48 PM  CC: [name], MD          Marshell Garfinkel MD [facility] 08/28/2019, 2:57 PM

## 2019-08-28 NOTE — Patient Instructions (Signed)
I am glad you are doing well with your breathing We will schedule pulmonary function test in 3 months Follow-up in clinic after test

## 2019-10-13 ENCOUNTER — Telehealth: Payer: Self-pay | Admitting: Pulmonary Disease

## 2019-10-14 NOTE — Telephone Encounter (Signed)
Received denial from insurance plan due to delay in answering additional PA questions. Faxed chart notes for expedited appeal for patient's Dupixent.  Phone# [phone]

## 2019-10-22 ENCOUNTER — Other Ambulatory Visit: Payer: Self-pay | Admitting: Family Medicine

## 2019-10-22 NOTE — Telephone Encounter (Signed)
Rachael, please advise if you have an update on the appeal for pt's Dupixent.

## 2019-10-22 NOTE — Telephone Encounter (Signed)
No updates. Called [facility], appeal is still pending. Can take up to 15 days.

## 2019-10-28 NOTE — Telephone Encounter (Signed)
Called [employer] to check status of Appeal, rep states they are having system issues and requested to call back in 2-3 hours.  Called patient and advised.

## 2019-10-28 NOTE — Telephone Encounter (Signed)
Patient checking status of prior authorization for Dupixent injection. Patient phone number is [phone].

## 2019-10-29 NOTE — Telephone Encounter (Signed)
Attempted to call [employer] to check on status of appeal, was transferred 5 times, after 1 hour, last rep disconnected the line. Will try again at a later time.  Refaxed appeal request with note to advise of status

## 2019-11-07 NOTE — Telephone Encounter (Signed)
Called patient's insurance, Was transferred 5 times and could not get to the correct department to get status update.  Called patient, she received a new letter and will fax/email it to me on Monday. Patient has 2 weeks of medication on hand. Will follow up.

## 2019-11-12 NOTE — Telephone Encounter (Signed)
Left message for patient to follow up on PA letter she received.

## 2019-11-19 ENCOUNTER — Other Ambulatory Visit: Payer: Self-pay | Admitting: Pulmonary Disease

## 2019-11-19 DIAGNOSIS — J849 Interstitial pulmonary disease, unspecified: Secondary | ICD-10-CM

## 2019-11-20 ENCOUNTER — Ambulatory Visit (INDEPENDENT_AMBULATORY_CARE_PROVIDER_SITE_OTHER): Payer: No Typology Code available for payment source | Admitting: Pulmonary Disease

## 2019-11-20 ENCOUNTER — Other Ambulatory Visit: Payer: Self-pay

## 2019-11-20 DIAGNOSIS — J849 Interstitial pulmonary disease, unspecified: Secondary | ICD-10-CM | POA: Diagnosis not present

## 2019-11-20 LAB — PULMONARY FUNCTION TEST
DL/VA % pred: 103 %
DL/VA: 4.41 ml/min/mmHg/L
DLCO cor % pred: 107 %
DLCO cor: 21.77 ml/min/mmHg
DLCO unc % pred: 107 %
DLCO unc: 21.77 ml/min/mmHg
FEF 25-75 Post: 2.71 L/sec
FEF 25-75 Pre: 1.44 L/sec
FEF2575-%Change-Post: 87 %
FEF2575-%Pred-Post: 106 %
FEF2575-%Pred-Pre: 57 %
FEV1-%Change-Post: 17 %
FEV1-%Pred-Post: 91 %
FEV1-%Pred-Pre: 77 %
FEV1-Post: 2.41 L
FEV1-Pre: 2.05 L
FEV1FVC-%Change-Post: 11 %
FEV1FVC-%Pred-Pre: 91 %
FEV6-%Change-Post: 5 %
FEV6-%Pred-Post: 91 %
FEV6-%Pred-Pre: 86 %
FEV6-Post: 2.98 L
FEV6-Pre: 2.82 L
FEV6FVC-%Change-Post: 0 %
FEV6FVC-%Pred-Post: 102 %
FEV6FVC-%Pred-Pre: 102 %
FVC-%Change-Post: 5 %
FVC-%Pred-Post: 88 %
FVC-%Pred-Pre: 84 %
FVC-Post: 2.99 L
FVC-Pre: 2.84 L
Post FEV1/FVC ratio: 81 %
Post FEV6/FVC ratio: 100 %
Pre FEV1/FVC ratio: 72 %
Pre FEV6/FVC Ratio: 99 %
RV % pred: 139 %
RV: 2.6 L
TLC % pred: 110 %
TLC: 5.48 L

## 2019-11-20 NOTE — Progress Notes (Signed)
PFT done today. 

## 2019-11-20 NOTE — Telephone Encounter (Signed)
Submitted appeal and additional documentation through [employer]eds. Will update once we receive a response.  (Key: [id number])

## 2019-12-02 NOTE — Telephone Encounter (Signed)
  Called patient's insurance, Was transferred 5 times and could not get to the correct department to get status update. Was on phone for 2 hours trying to figure out status.   Called patient to advise, she will also try to call insurance to check status or see why [employer] is not processing her appeal.  Pharmacy rep did say we can try to resubmit a new PA next week, since this has been going on so long. Will follow up.   Will coordinate a Dupixent 300mg  sample for patient.

## 2019-12-09 NOTE — Telephone Encounter (Signed)
Patient came by office and picked up [place] 300mg  sample.

## 2019-12-22 NOTE — Telephone Encounter (Signed)
Message open for pending Dupixent  PA documentation.

## 2019-12-22 NOTE — Telephone Encounter (Signed)
Can this encounter be closed? Thanks

## 2019-12-23 NOTE — Telephone Encounter (Signed)
Called [employer], rep states she did not see any see any appeals on file and requested it to be refaxed. Faxed to urgent appeal line. Will follow up.  Sent message to patient to update.  Phone# [phone]

## 2020-01-02 ENCOUNTER — Telehealth: Payer: Self-pay | Admitting: Pulmonary Disease

## 2020-01-02 NOTE — Telephone Encounter (Signed)
Patient called office requesting [place] sample.  Dupixent sample with Patient name/DOB in injection refrigerator for pick up next week.

## 2020-01-02 NOTE — Telephone Encounter (Signed)
Called and spoke with Patient.  Patient requested Dupixent sample, because her PA is still being appealed with insurance.  Dupixent sample in injection refrigerator for Patient to pick up next week.

## 2020-01-02 NOTE — Telephone Encounter (Signed)
Spoke to [place] rep on issue with patient's appeal for Dupixent PA renewal. Rep states plan will not approve due to patient no longer using an ICS inhaler.  Patient meets all other requirements for PA renewal for :  (1) Documentation of positive clinical response to Dupixent therapy as demonstrated by at least one of the following:  (A) Reduction in the frequency of exacerbations. (B) Decreased utilization of rescue medications. (C) Increase in percent predicted forced expiratory volume in one second from pretreatment baseline. (D) Reduction in severity or frequency of asthma-related symptoms (for example: wheezing, shortness of breath, coughing). (E) Reduction in oral corticosteroid requirements.   (2) Dupixent is being used in combination with an inhaled corticosteroid-containing controller medication.

## 2020-01-05 NOTE — Telephone Encounter (Signed)
Faxed appeal with updated chart notes to [facility]. Will update once we receive a response.

## 2020-01-06 NOTE — Telephone Encounter (Signed)
Patient came by office today and picked up sample box of Dupxent 300mg  pens for next 2 injections.

## 2020-01-16 NOTE — Telephone Encounter (Signed)
Called [facility] who referred me to [facility] who referred me to another phone for utilization management [phone]) who referred me to the appeals department [phone] ext [phone]). Unsuccessful resolution again. Faxed for URGENT review again to another fax.  Provider line phone number: [phone] Fax: [phone]  Knox Saliva, PharmD, MPH Clinical Pharmacist (Rheumatology and Pulmonology)

## 2020-01-22 NOTE — Telephone Encounter (Signed)
Called [facility] for appeal update. Transferred from provider services to Shared Services intake department (phone [phone]) back to provider services [phone], passcode [phone]).  Told rep that appeal needs to be urgently reviewed and need confirmation of appeal receipt. Rep is sending email to appeals department to either fax or call back with confirmation of receipt.  Call reference # (when calling back to track communication): [id number]  No estimated response time but should be by next week. He said if he didn't hear back, he'd re-send an email.

## 2020-01-28 NOTE — Telephone Encounter (Signed)
Called [facility] for PA appeal update on Dupixent [phone]). Transferred to [facility] Shared Services (direct phone to live rep is [phone] option 0#0# )  Rep will resend email as urgent. Unable to confirm receipt of Dupixent appeal documents.  Patient's denial was received on 12-19-19 which is in place for 180 days (06/16/20). Technically would be able to re-initiate PA on 06/17/20.  Call ref # : [id number]

## 2020-02-02 ENCOUNTER — Telehealth: Payer: Self-pay | Admitting: Pulmonary Disease

## 2020-02-03 NOTE — Telephone Encounter (Signed)
Spoke with Patient. Patient is needing a Dupixent sample while PA is still being processed. Dupixent sample in injection room refrigerator with Patient name and dob. Patient stated her husband will come by office 02/04/20 to pick up.

## 2020-02-04 NOTE — Telephone Encounter (Signed)
Patient's Spouse Skip came to office today and picked up Patient's Dupixent sample for month.

## 2020-02-04 NOTE — Telephone Encounter (Signed)
Called [employer] to f/u on appeal. Provider services department confirmed they've received appeal on 01/25/20. I reiterated that we've attempted appeal since October 2020 - with incorrect fax numbers and no confirmation of appeal receipt.  Estimated turnaround time right now is 30-60 days. Rep escalated case via email to review department to respond within 30 days of 01/25/20.   Document ID# [id number]  Email escalation (reference #): [id number]  Phone: [phone] option 0#0#  Will f/u.  Chesley Mires, PharmD, MPH Clinical Pharmacist (Rheumatology and Pulmonology)

## 2020-02-11 ENCOUNTER — Telehealth: Payer: Self-pay | Admitting: Pulmonary Disease

## 2020-02-11 NOTE — Telephone Encounter (Signed)
Attempted to return call to [place] and was transferred to a voicemail. Patient's appeal is currently pending with plan.

## 2020-02-13 IMAGING — DX LEFT ELBOW - COMPLETE 3+ VIEW
4 series · 4 of 4 positions shown · non-contrast
Comparison: None.

CLINICAL DATA: Two days left elbow pain and swelling with redness.

EXAM:
LEFT ELBOW - COMPLETE 3+ VIEW

[elbow ap]
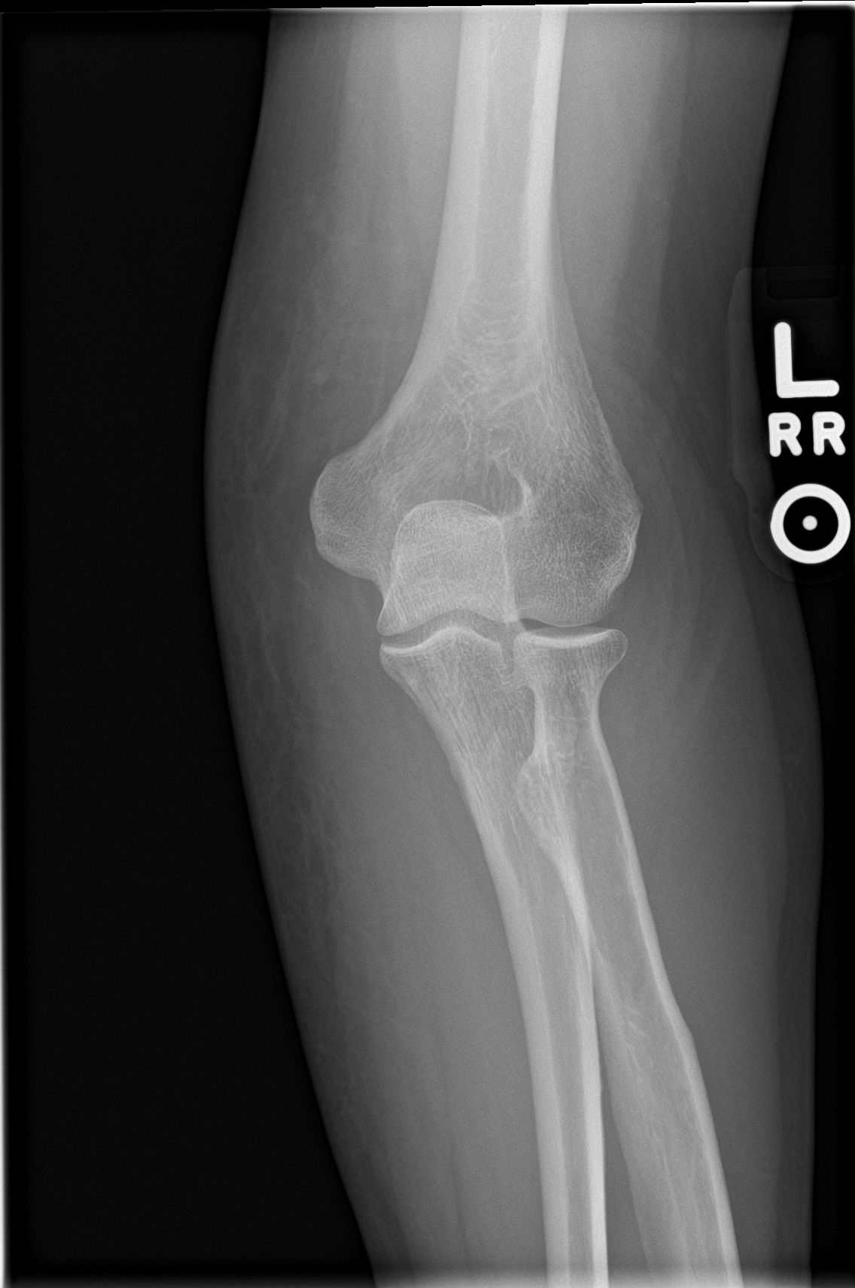

[elbow obl (1 of 2)]
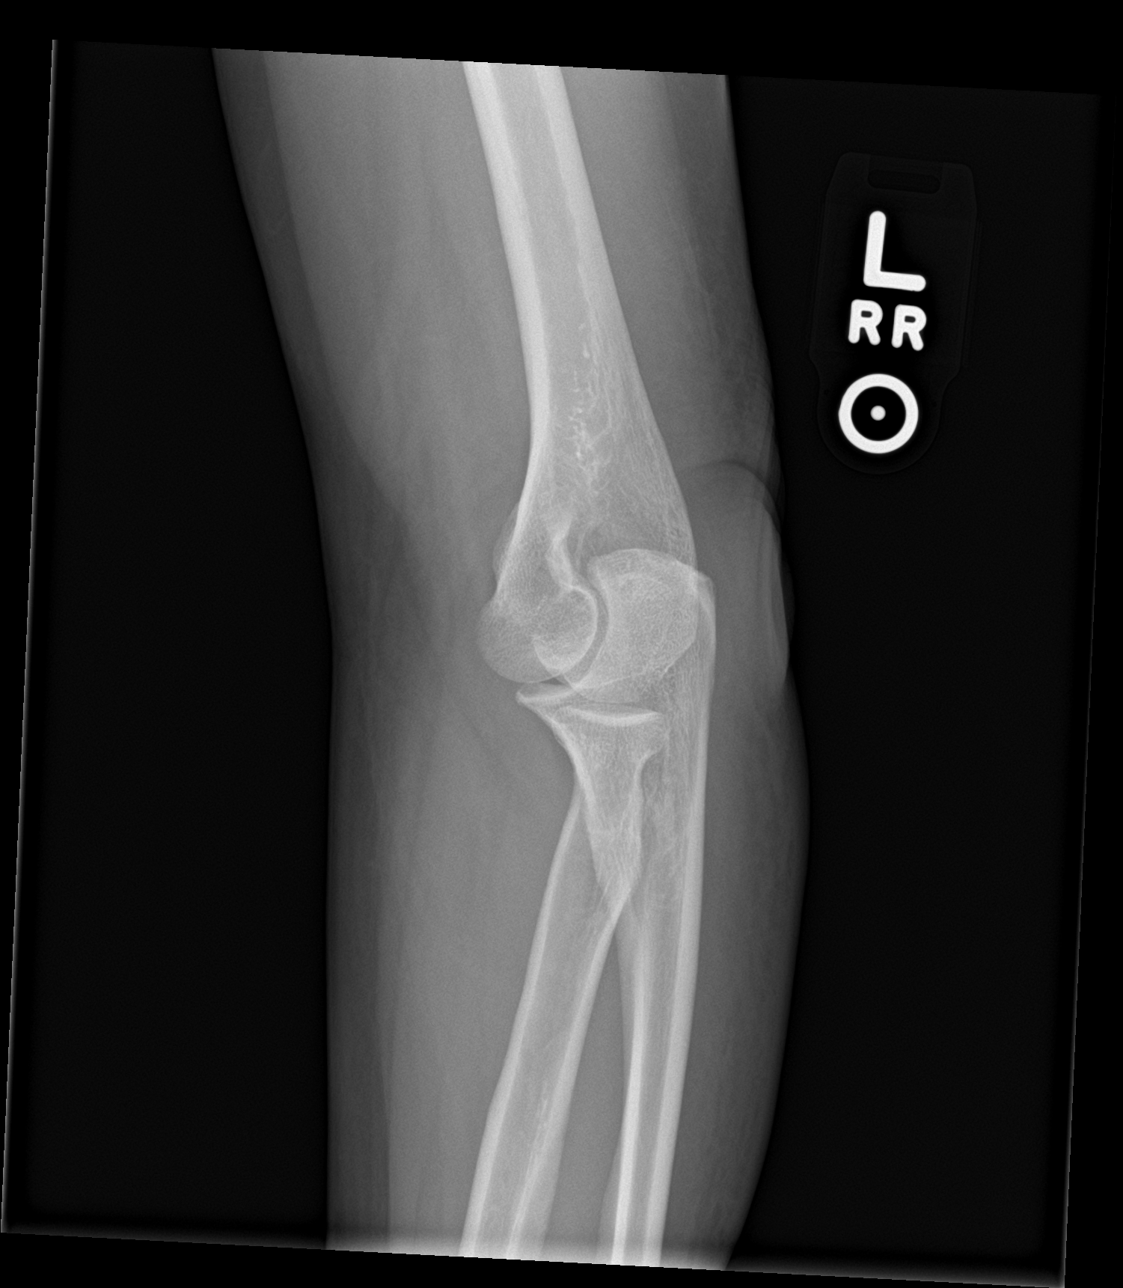

[elbow obl (2 of 2)]
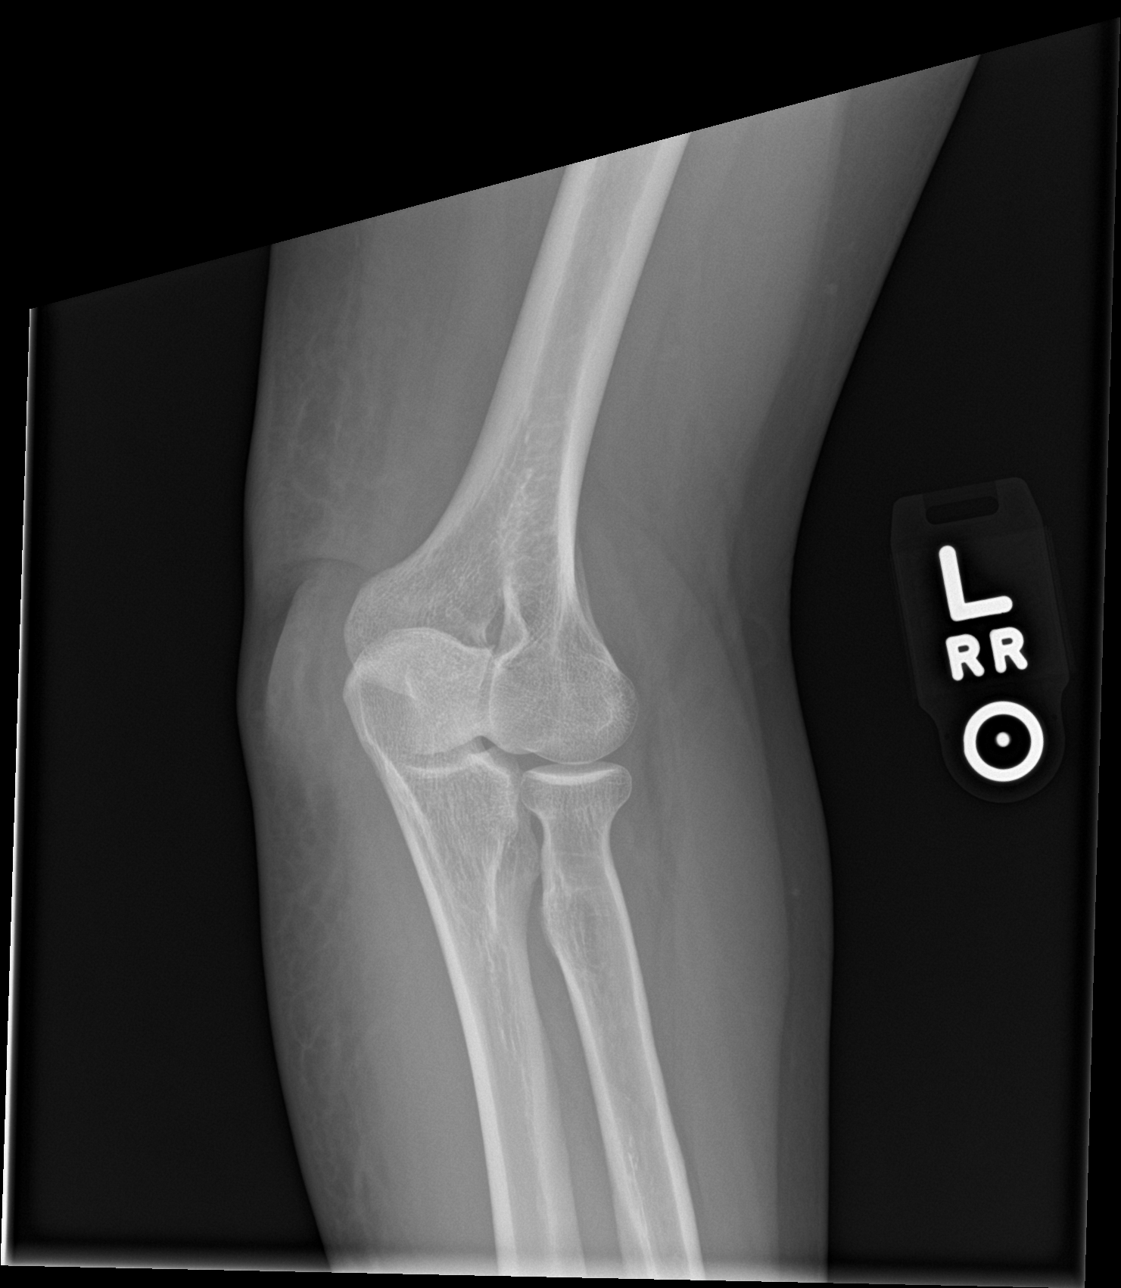

[elbow lat]
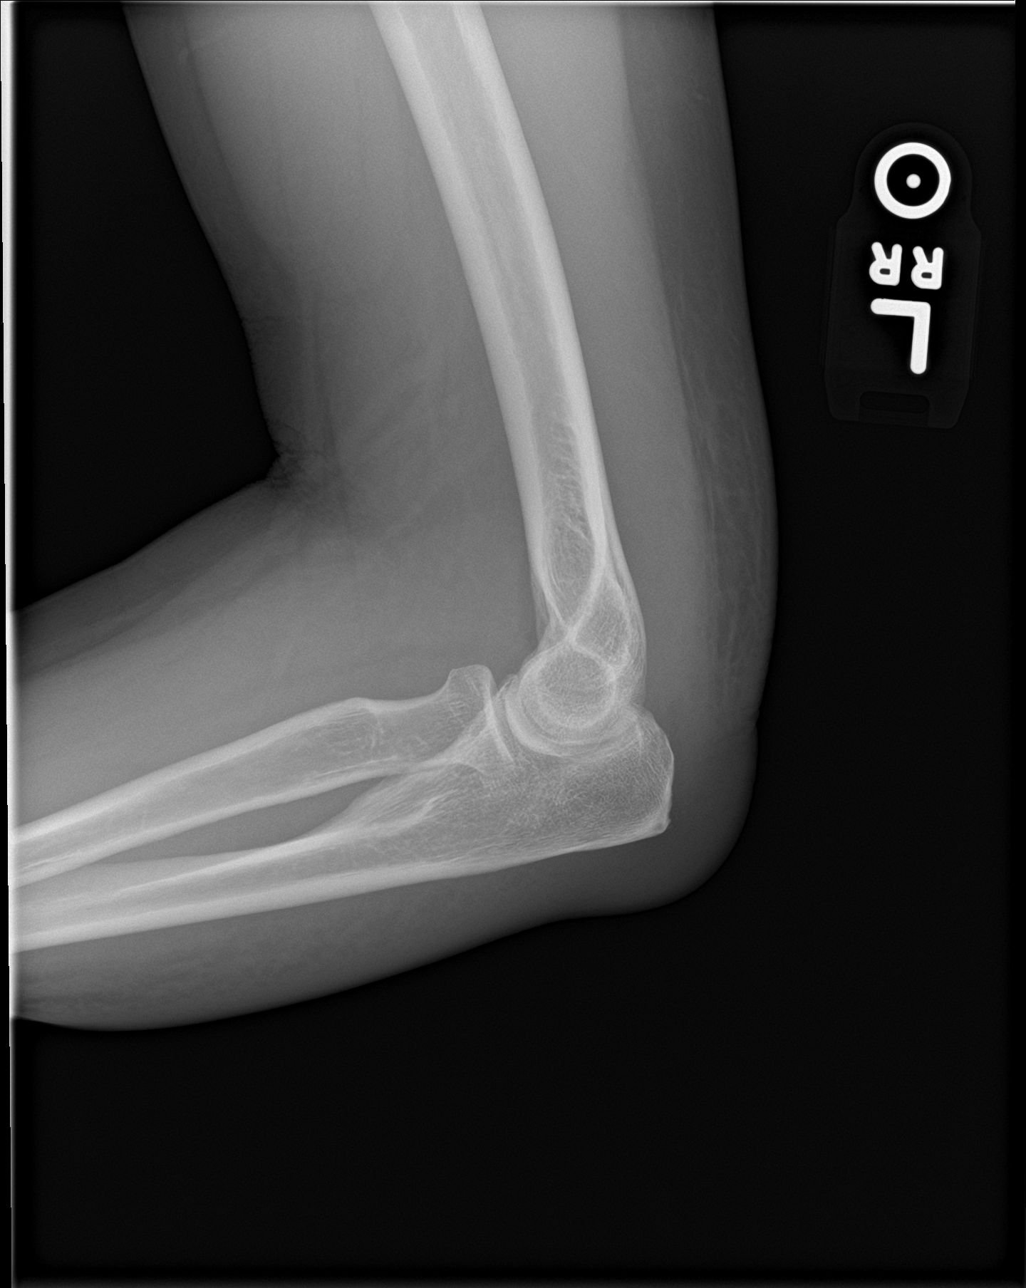

[4 of 4 positions shown; findings below may reference images not displayed]

FINDINGS: Minimal soft tissue swelling over the extensor surface of the elbow.
No evidence of fracture or dislocation. No significant joint
effusion.
IMPRESSION: No acute fracture. Mild soft tissue swelling over the extensor
surface of the elbow.

## 2020-02-25 NOTE — Telephone Encounter (Signed)
Called [employer] to f/u on  Dupixent appeal. They stated to call back 03/06/20 for possible update.  [phone] 0#0#

## 2020-02-25 NOTE — Telephone Encounter (Addendum)
Per Dupixent rep, patient may be eligible for Dupixent Interim Access support since we are waiting for appeal response. If approved, she would be able to get Dupixent for up to 6 months while appeal is in process.   Turnaround time is 24-48 hours but may be up to 5 business days to receive response.  If first level of appeal is denied, and second level of appeal is available, this must be complete. If second level of appeal is denied, then patient would need to apply for patient assistance application. If there is no second level of appeal, then would need to apply for patient assistance application.  Will f/u with Dupixent in 5 business day if no response by then.  Knox Saliva, PharmD, MPH Clinical Pharmacist (Rheumatology and Pulmonology)

## 2020-03-02 ENCOUNTER — Telehealth: Payer: Self-pay | Admitting: Pulmonary Disease

## 2020-03-02 NOTE — Telephone Encounter (Signed)
Called and spoke with Patient.  Dupixent sample labeled for Patient pick up. Dupixent sample in injection refrigerator.

## 2020-03-03 NOTE — Telephone Encounter (Signed)
Patient approved through [place] Interim Access - no time limit on current approval in program however I was previously told it would be 6 months. If patient's pending appeal is denied, then we will have to move forward to second level of appeal. If no second level of appeal available then she'd have to transition to [place] patient assistance program.  Called patient to notify about approval. Provided her with phone number [phone]) to order shipment to her home. Patient verbalized understanding. Her husband was planning on stopping by today to pick up supply - she is due tomorrow for dose.  Knox Saliva, PharmD, MPH Clinical Pharmacist (Rheumatology and Pulmonology)

## 2020-03-16 NOTE — Telephone Encounter (Signed)
Patient's Dupixent appeal through [facility] still in process for > 3 months. Patient is temporarily enrolled into Dupixent [place] Interim Access program to receive Dupixent at no cost until appeal processes. Signed the encounter per triage request, but will retain in pharmacy team inbox since we are currently processing  Knox Saliva, PharmD, MPH Clinical Pharmacist (Rheumatology and Pulmonology)

## 2020-03-16 NOTE — Telephone Encounter (Signed)
Please advise if this encounter can be closed. Thanks.  

## 2020-04-09 ENCOUNTER — Telehealth: Payer: Self-pay | Admitting: Pharmacist

## 2020-04-09 ENCOUNTER — Other Ambulatory Visit: Payer: Self-pay | Admitting: Pharmacist

## 2020-04-09 MED ORDER — DUPIXENT 300 MG/2ML ~~LOC~~ SOAJ: 300.0000 mg | SUBCUTANEOUS | 0 refills | Status: DC

## 2020-04-09 NOTE — Telephone Encounter (Signed)
Opened in error

## 2020-04-09 NOTE — Telephone Encounter (Signed)
Received refill request from [facility] for patient's Dupixent.  Dose: 300mg  SQ autoinjector every 14 days  Patient last seen in office on 08/28/19 by Dr. Vaughan Browner. He had wanted f/u after PFT results. Will route to triage pool to schedule f/u visit since PFT was completed 11/20/19  Knox Saliva, PharmD, MPH Clinical Pharmacist (Rheumatology and Pulmonology)

## 2020-04-12 NOTE — Telephone Encounter (Signed)
Called and spoke with pt stating to her that we needed to get her scheduled for an appt with Dr. Vaughan Browner for f/u per pharmacy. Pt verbalized understanding. appt has been scheduled for pt with Dr. Vaughan Browner. Nothing further needed.

## 2020-05-04 NOTE — Telephone Encounter (Signed)
Called [employer] to check on status of Dupixent appeal - rep hung up phone twice. Will f/u later this week.  Patient continues to have access to [place] through [place] Interim Access program as appeal is in process.

## 2020-05-05 ENCOUNTER — Encounter: Payer: Self-pay | Admitting: Pulmonary Disease

## 2020-05-05 ENCOUNTER — Ambulatory Visit (INDEPENDENT_AMBULATORY_CARE_PROVIDER_SITE_OTHER): Payer: No Typology Code available for payment source | Admitting: Pulmonary Disease

## 2020-05-05 ENCOUNTER — Other Ambulatory Visit: Payer: Self-pay

## 2020-05-05 VITALS — BP 136/78 | HR 81 | Temp 98.0°F | Ht 64.0 in | Wt 144.6 lb

## 2020-05-05 DIAGNOSIS — J4551 Severe persistent asthma with (acute) exacerbation: Secondary | ICD-10-CM | POA: Diagnosis not present

## 2020-05-05 MED ORDER — BUDESONIDE-FORMOTEROL FUMARATE 80-4.5 MCG/ACT IN AERO
2.0000 | INHALATION_SPRAY | Freq: Two times a day (BID) | RESPIRATORY_TRACT | 5 refills | Status: DC
Start: 2020-05-05 — End: 2022-10-23

## 2020-05-05 NOTE — Progress Notes (Signed)
Tammie Sanders    [id number]    [birth year]  Primary Care Physician:Miller, Lattie Haw, MD  Referring Physician: [name], MD [place],  [place] [address]   Problem list: Severe persistent asthma Positive ANA.  No evidence of ILD on HRCT in 2859  HPI: 55 year old with history of asthma, PE, seizures Previously evaluated in the pulmonary clinic around 2014 for cough which was felt to be upper respiratory.  She is evaluated for allergies with negative RAST panel and negative IgE  Had significant mold exposure in fall 2014.  Since then she has had ongoing issues with cough, shortness of breath, chest tightness, wheezing.  The only thing that is worked in the past is prednisone She is been tried on multiple inhalers including Symbicort, Spiriva, Singulair.  Evaluated by Dr. Fredderick Phenix at allergy.  Allergy testing showed sensitivity to only mold   She also has a positive ANA in 2020.  Evaluated by Dr. 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  No ongoing exposure.  No mold, hot tub, Jacuzzi Smoking history: 5-pack-year smoker.  Quit in 1990. Travel history: No significant travel history Relevant family history: Mother has asthma, rheumatoid arthritis and lupus.  Interval history: Started on Dupixent in March 2020 with remarkable improvement in symptoms She states that her life is back to normal now and the therapy is a Sports administrator  She is using the Symbicort 1-2 times a day.  Stop Singulair, Spiriva  Outpatient Encounter Medications as of 05/05/2020  Medication Sig  . ADDERALL XR 20 MG 24 hr capsule TK 1 C PO QD  . albuterol (PROVENTIL HFA;VENTOLIN HFA) 108 (90 Base) MCG/ACT inhaler Inhale 2 puffs into the lungs every 6 (six) hours as needed for wheezing or shortness of breath.   .  Dupilumab (DUPIXENT) 300 MG/2ML SOPN Inject 300 mg into the skin every 14 (fourteen) days.  Marland Kitchen EPINEPHrine 0.3 mg/0.3 mL IJ SOAJ injection Inject 0.3 mLs (0.3 mg total) into the muscle once.  Marland Kitchen FLUoxetine (PROZAC) 40 MG capsule Take 40 mg by mouth daily.  Marland Kitchen ibuprofen (ADVIL) 200 MG tablet Take 200 mg by mouth every 6 (six) hours as needed for headache or mild pain.  . metoprolol succinate (TOPROL-XL) 25 MG 24 hr tablet Take 25 mg by mouth daily.  . montelukast (SINGULAIR) 10 MG tablet Take 10 mg by mouth at bedtime.  . nabumetone (RELAFEN) 750 MG tablet TAKE 1 TABLET(750 MG) BY MOUTH TWICE DAILY AS NEEDED  . oxybutynin (DITROPAN-XL) 10 MG 24 hr tablet Take 10 mg by mouth daily.   Marland Kitchen SPIRIVA RESPIMAT 1.25 MCG/ACT AERS Inhale 2 puffs into the lungs daily.  . SYMBICORT 160-4.5 MCG/ACT inhaler Inhale 2 puffs into the lungs 2 (two) times daily.   No facility-administered encounter medications on file as of 05/05/2020.   Physical Exam: Blood pressure 136/78, pulse 81, temperature 98 F (36.7 C), temperature source Temporal, height 5\' 4"  (1.626 m), weight 144 lb 9.6 oz (65.6 kg), SpO2 98 %. Gen:      No acute distress HEENT:  EOMI, sclera anicteric Neck:     No masses; no thyromegaly Lungs:    Clear to auscultation bilaterally; normal respiratory effort CV:         Regular rate and  rhythm; no murmurs Abd:      + bowel sounds; soft, non-tender; no palpable masses, no distension Ext:    No edema; adequate peripheral perfusion Skin:      Warm and dry; no rash Neuro: alert and oriented x 3 Psych: normal mood and affect  Data Reviewed: Imaging: Chest x-ray 07/24/2012-no acute cardiopulmonary abnormality. HRCT 04/11/2018-normal test with no ILD I have reviewed the images personally  PFTs: 07/26/2018 FVC 3.32 [97%], FEV1 2.64 [98%], F/F 80, TLC 5.34 [107%], DLCO 23.82 [116%] Minimal obstructive airway disease with bronchodilator response  FENO 03/21/2018-103  ACT score 05/05/2020-25  Labs: CBC  05/28/2018-WBC 19.5, eos 1%, absolute eosinophil count 194 IgE 03/21/2018-40  ANA 03/21/2018-1:320, nuclear  Assessment:  Severe persistent asthma Improved with Dupixent Continue Symbicort.  Deescalate dose to 80/4.5 Check pulmonary function test for evaluation  Positive ANA Previous HRCT with no ILD No evidence of autoimmune disease per rheumatology evaluation  Plan/Recommendations: - Continue Dupixent, Symbicort  Follow-up in 6 months  Marshell Garfinkel MD [facility] 05/05/2020, 9:01 AM  CC: [name], MD

## 2020-05-05 NOTE — Addendum Note (Signed)
Addended by: Elton Sin on: 05/05/2020 09:14 AM   Modules accepted: Orders

## 2020-05-05 NOTE — Patient Instructions (Signed)
I am glad you are doing well with regard to your breathing  Continue the Dupixent and Symbicort We will reduce dose to 80/4.5 Follow-up in 6 months

## 2020-05-16 ENCOUNTER — Telehealth: Payer: Self-pay | Admitting: *Deleted

## 2020-05-16 NOTE — Telephone Encounter (Signed)
PA was initiated for dupixent and form was received from [employer].  Form has been filled out and will placed in PM sign folder and this will need to be faxed back.

## 2020-05-17 NOTE — Telephone Encounter (Signed)
Thanks!   Pharmacy team will keep an eye out for the signed for to ensure it gets faxed.

## 2020-05-19 NOTE — Telephone Encounter (Signed)
Located PA form, it had been faxed without provider signature. Finished completing and faxed to plan with chart notes.  Will update once we receive a response.  Phone# [phone]

## 2020-05-20 MED ORDER — DUPIXENT 300 MG/2ML ~~LOC~~ SOAJ: 300.0000 mg | SUBCUTANEOUS | 1 refills | Status: DC

## 2020-05-20 NOTE — Telephone Encounter (Signed)
Rx for Dupixent 300mg  every 14 days (autoinjector) sent to [facility]. Patient provided with pharmacy phone number. She states she has copay card information that is active through 2022. Advised her to provide this to [place] to bill as secondary.  She will be due for Dupixent next Tuesday, 05/25/20.  All questions encouraged and answered. She will reach out if she has any trouble scheduling shipment  Knox Saliva, PharmD, MPH Clinical Pharmacist (Rheumatology and Pulmonology)

## 2020-05-20 NOTE — Telephone Encounter (Signed)
Received notification from [facility] regarding a prior authorization for [place]. Authorization has been APPROVED from 05/17/20 to 05/17/21.   Authorization # [id number] Phone # [phone]  Please send prescription to [facility].

## 2020-05-25 NOTE — Telephone Encounter (Signed)
Called [facility], patient's shipment is on hold due to needing her updated [place] copay card information.  Called Dupixent and obtained.  BIN- [id number] PCN- Loyalty ID- [id number] Grp- [id number]  Phone# [phone]  Called [facility] and provided they were able to process for $5.00 copay. Conferenced patient on the line, she set up delivery for 05/26/20 to her home.   Patient is ok waiting to inject tomorrow, she will call tomorrow if shipment does not arrive to pick up a sample.

## 2020-09-02 ENCOUNTER — Telehealth: Payer: Self-pay | Admitting: Pulmonary Disease

## 2020-09-02 DIAGNOSIS — J4551 Severe persistent asthma with (acute) exacerbation: Secondary | ICD-10-CM

## 2020-09-02 MED ORDER — ALBUTEROL SULFATE HFA 108 (90 BASE) MCG/ACT IN AERS: 2.0000 | INHALATION_SPRAY | Freq: Four times a day (QID) | RESPIRATORY_TRACT | 5 refills | Status: AC | PRN

## 2020-09-02 NOTE — Telephone Encounter (Signed)
Call returned to patient, confirmed DOB. States her husband went out of town, came back Sunday night. Notified Monday morning that he was covid positive. She started feeling sick yesterday. She took a home covid test and it was negative. She is feeling better today than she did yesterday. She had to use her rescue inhaler yesterday x3 which she reports she rarely ever has to use. Denies fevers. She had a headache, cough, and body ache yesterday but no longer having these symptoms today. She keeps repeating that she feels better today but she is wanting recommendations. Advised patient to go ahead and call her pharmacy for to get set up for covid testing. Voiced understanding.   RB please advise. Thanks :)

## 2020-09-02 NOTE — Telephone Encounter (Signed)
Called and spoke with pt letting her know the recs stated by RB. Pt said she would call us once she has the covid test results to let us know what they are. Nothing further needed.

## 2020-09-02 NOTE — Telephone Encounter (Signed)
Agree with COVID testing Okay to refill albuterol as requested

## 2020-09-02 NOTE — Telephone Encounter (Signed)
Called and spoke with pt letting her know that I was going to refill her rescue inhaler Rx for her and she verbalized understanding. Pt will be getting a covid test done today at [facility] around 4:00. Stated to pt to call us if the results came back positive.  Stated to her once we heard from [place] about recs that we would call her back to let her know and she verbalized understanding.

## 2020-09-02 NOTE — Telephone Encounter (Signed)
Pt calling to see if she can get an refillo n her emergency inhaler called in to [facility] in [place] since that is where she is going to get he covid test. Pt can be reached at [phone]

## 2020-09-06 ENCOUNTER — Telehealth: Payer: Self-pay | Admitting: Pulmonary Disease

## 2020-09-06 ENCOUNTER — Telehealth (INDEPENDENT_AMBULATORY_CARE_PROVIDER_SITE_OTHER): Payer: No Typology Code available for payment source | Admitting: Adult Health

## 2020-09-06 ENCOUNTER — Encounter: Payer: Self-pay | Admitting: Adult Health

## 2020-09-06 DIAGNOSIS — U071 COVID-19: Secondary | ICD-10-CM

## 2020-09-06 DIAGNOSIS — J4551 Severe persistent asthma with (acute) exacerbation: Secondary | ICD-10-CM

## 2020-09-06 MED ORDER — PREDNISONE 20 MG PO TABS
20.0000 mg | ORAL_TABLET | Freq: Every day | ORAL | 0 refills | Status: DC
Start: 2020-09-06 — End: 2021-04-26

## 2020-09-06 NOTE — Telephone Encounter (Signed)
Call returned to patient, confirmed DOB. Reports developing symptoms last Wednesday. She took a home covid test and it was negative however her husband was positive on Monday. Reports a fever Saturday morning, however she took tylenol and she no longer has fever. She reports a positive covid test on 8/6. She is having intermittent chills and sweats. Reports productive cough, congestion, sneezing, bad headache, and post nasal drip. She confirms she is taking symbicort daily. Complains of back pain between shoulder blades. Using albuterol 1-2x/day since testing positive. She reports she does have a nebulizer that was given to her by her allergist with albuterol if it recommended.    EW please advise. Thanks :)

## 2020-09-06 NOTE — Telephone Encounter (Signed)
Pt had visit and pred called in

## 2020-09-06 NOTE — Patient Instructions (Addendum)
Prednisone 20 mg daily for 5 days, take with food Continue on Symbicort 2 puffs twice daily Albuterol inhaler as needed Tylenol as needed Fluids and rest Quarantine as discussed. Follow-up as planned and as needed  Please contact office for sooner follow up if symptoms do not improve or worsen or seek emergency care       Person Under Monitoring Name: Tammie Sanders  Location: [address] [place] [address]   Infection Prevention Recommendations for Individuals Confirmed to have, or Being Evaluated for, 2019 Novel Coronavirus (COVID-19) Infection Who Receive Care at Home  Individuals who are confirmed to have, or are being evaluated for, COVID-19 should follow the prevention steps below until a healthcare provider or local or state health department says they can return to normal activities.  Stay home except to get medical care You should restrict activities outside your home, except for getting medical care. Do not go to work, school, or public areas, and do not use public transportation or taxis.  Call ahead before visiting your doctor Before your medical appointment, call the healthcare provider and tell them that you have, or are being evaluated for, COVID-19 infection. This will help the healthcare provider's office take steps to keep other people from getting infected. Ask your healthcare provider to call the local or state health department.  Monitor your symptoms Seek prompt medical attention if your illness is worsening (e.g., difficulty breathing). Before going to your medical appointment, call the healthcare provider and tell them that you have, or are being evaluated for, COVID-19 infection. Ask your healthcare provider to call the local or state health department.  Wear a facemask You should wear a facemask that covers your nose and mouth when you are in the same room with other people and when you visit a healthcare provider. People who live with or visit  you should also wear a facemask while they are in the same room with you.  Separate yourself from other people in your home As much as possible, you should stay in a different room from other people in your home. Also, you should use a separate bathroom, if available.  Avoid sharing household items You should not share dishes, drinking glasses, cups, eating utensils, towels, bedding, or other items with other people in your home. After using these items, you should wash them thoroughly with soap and water.  Cover your coughs and sneezes Cover your mouth and nose with a tissue when you cough or sneeze, or you can cough or sneeze into your sleeve. Throw used tissues in a lined trash can, and immediately wash your hands with soap and water for at least 20 seconds or use an alcohol-based hand rub.  Wash your [facility] your hands often and thoroughly with soap and water for at least 20 seconds. You can use an alcohol-based hand sanitizer if soap and water are not available and if your hands are not visibly dirty. Avoid touching your eyes, nose, and mouth with unwashed hands.

## 2020-09-06 NOTE — Telephone Encounter (Signed)
Pt stated that she went to [facility] on (09/02/2020) and she stated that she got her Covid-19 results back on (09/04/2020) and she tested positive for Covid-19. Symptoms; tight chest, coughing, sinus congestion, nasal drip, headache, chills and sweating but states she is not running a fever, no wheezing.   Pharmacy; [facility], [place] - [address]  [address] [place], [place] [address]   Pls regard; [phone]

## 2020-09-06 NOTE — Telephone Encounter (Signed)
Called and and there was no answer and unfortunately her VM was full so unable to leave msg, will call back.

## 2020-09-06 NOTE — Telephone Encounter (Signed)
Please schedule video visit today with any provider that has opening. Otherwise she should contact her PCP or go to UC. She may be outside the window to receive antivirals

## 2020-09-06 NOTE — Progress Notes (Signed)
Virtual Visit via Video Note  I connected with Tammie Sanders on 09/06/20 at  2:00 PM EDT by a video enabled telemedicine application and verified that I am speaking with the correct person using two identifiers.  Location: Patient: Home  Provider: Office    I discussed the limitations of evaluation and management by telemedicine and the availability of in person appointments. The patient expressed understanding and agreed to proceed.  History of Present Illness: 55 year old female followed for severe persistent asthma  Today's video visit is an acute office visit.  Patient complains over the last 6 days that she has had nasal congestion, drainage, cough, headache.  Patient's husband recently tested positive for COVID-19.  Patient did a home test when she developed symptoms on August 3 but was negative.  She went for a PCR test for COVID-19 on August 4.  Her results came back positive on August 6.  Today is day 6 of symptoms.  Patient has been vaccinated x3.  She says her symptoms have slightly improved over the last 1 to 2 days but continues to have some ongoing tightness and intermittent wheezing.  She also has ongoing nasal congestion all mucus is clear.  She denies any fever or discolored mucus.  Appetite is good with no nausea vomiting or diarrhea.  No chest pain, calf pain, hemoptysis. Patient remains on Symbicort twice daily.  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  No ongoing exposure.  No mold, hot tub, Jacuzzi Smoking history: 5-pack-year smoker.  Quit in 1990. Travel history: No significant travel history Relevant family history: Mother has asthma, rheumatoid arthritis and lupus.  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 8 g 5   budesonide-formoterol (SYMBICORT) 80-4.5 MCG/ACT inhaler Inhale 2 puffs into the lungs 2 (two) times daily. 10.2 g 5   Dupilumab (DUPIXENT) 300 MG/2ML SOPN Inject 300 mg into the skin every 14 (fourteen) days. 12 mL 1   EPINEPHrine 0.3 mg/0.3 mL IJ SOAJ injection Inject 0.3 mLs (0.3 mg total) into the muscle once. 1 Device 1   FLUoxetine (PROZAC) 40 MG capsule Take 40 mg by mouth daily.     ibuprofen (ADVIL) 200 MG tablet Take 200 mg by mouth every 6 (six) hours as needed for headache or mild pain.     metoprolol succinate (TOPROL-XL) 25 MG 24 hr tablet Take 25 mg by mouth daily.     nabumetone (RELAFEN) 750 MG tablet TAKE 1 TABLET(750 MG) BY MOUTH TWICE DAILY AS NEEDED  60 tablet 6   oxybutynin (DITROPAN-XL) 10 MG 24 hr tablet Take 10 mg by mouth daily.      No current facility-administered medications on file prior to visit.    Observations/Objective: Speaks in full sentences with no audible distress.  Appears in no acute distress  Assessment and Plan: COVID-19 infection-patient is vaccinated x3 for COVID-19.  She has mild symptoms.  She is on day 6 of symptoms.  She is outside the window for the antiviral packs. Will continue with supportive care. Red flag symptoms discussed  Asthma exacerbation-mild with associated COVID-19 infection-we will give a short course of steroids.  Hold on antibiotics at this time. Asthma action plan discussed.   Follow-up in office as planned and as needed  Plan  Patient Instructions  Prednisone 20 mg daily for 5 days, take with food Continue on Symbicort 2 puffs twice daily Albuterol inhaler as needed Tylenol as needed Fluids and rest Quarantine as discussed. Follow-up as planned and as needed  Please contact office for sooner follow up if symptoms do not improve or worsen or seek emergency care       Follow Up Instructions:    I discussed the assessment and treatment plan with the patient. The patient was provided an opportunity to ask questions and all were answered. The patient agreed with the plan and demonstrated an understanding of the instructions.   The patient was advised to call back or seek an in-person evaluation if the symptoms worsen or if the condition fails to improve as anticipated.  I provided 30  minutes of non-face-to-face time during this encounter.   Rexene Edison, NP

## 2020-11-05 ENCOUNTER — Other Ambulatory Visit: Payer: Self-pay | Admitting: Pulmonary Disease

## 2020-11-05 DIAGNOSIS — J4551 Severe persistent asthma with (acute) exacerbation: Secondary | ICD-10-CM

## 2020-11-05 NOTE — Telephone Encounter (Signed)
Refill for Dupixent sent to [facility].  Patient had tele-visit with Rexene Edison, NP, for positive COVID19 test. Last seen by primary pulm provider, Dr. Vaughan Browner in April 2022 with plan to continue Dupixent and reduce Symbicort dose.  Knox Saliva, PharmD, MPH, BCPS Clinical Pharmacist (Rheumatology and Pulmonology)

## 2020-12-03 ENCOUNTER — Other Ambulatory Visit: Payer: Self-pay | Admitting: Chiropractor

## 2020-12-08 ENCOUNTER — Other Ambulatory Visit: Payer: Self-pay | Admitting: Chiropractor

## 2020-12-08 DIAGNOSIS — M5116 Intervertebral disc disorders with radiculopathy, lumbar region: Secondary | ICD-10-CM

## 2020-12-26 ENCOUNTER — Ambulatory Visit
Admission: RE | Admit: 2020-12-26 | Discharge: 2020-12-26 | Disposition: A | Discharge status: Home / Self Care | Payer: No Typology Code available for payment source | Source: Ambulatory Visit | Attending: Chiropractor | Admitting: Chiropractor

## 2020-12-26 ENCOUNTER — Other Ambulatory Visit: Payer: Self-pay

## 2020-12-26 DIAGNOSIS — M5116 Intervertebral disc disorders with radiculopathy, lumbar region: Secondary | ICD-10-CM

## 2021-04-18 ENCOUNTER — Telehealth: Payer: Self-pay | Admitting: Pharmacist

## 2021-04-18 NOTE — Telephone Encounter (Signed)
Received fax from [facility] regarding patient's [place] authorization expiring soon. Patient has not been sen in clinic since 05/05/20. ? ?Submitted a Prior Authorization RENEWAL request to [facility] for [place] via CoverMyMeds. Will update once we receive a response. ? ?Key: [id number] ? ?Will need f/u appt ? ?Knox Saliva, PharmD, MPH, BCPS ?Clinical Pharmacist (Rheumatology and Pulmonology) ?

## 2021-04-19 NOTE — Telephone Encounter (Signed)
Received notification from [facility] regarding a prior authorization for [place]. Authorization has been APPROVED from 04/18/21 to 04/19/22.  ? ?Patient must continue to fill through [place]: [phone]  ? ?Authorization # [id number] ? ?Phone # [phone] ? ?Knox Saliva, PharmD, MPH, BCPS ?Clinical Pharmacist (Rheumatology and Pulmonology) ? ?

## 2021-04-20 NOTE — Telephone Encounter (Signed)
Spoke with patient who states that she is in a meeting and will call back to schedule an appointment.  ?

## 2021-04-22 NOTE — Telephone Encounter (Signed)
Patient is scheduled 04/26/2021 with Geraldo Pitter, NP ?

## 2021-04-25 ENCOUNTER — Other Ambulatory Visit: Payer: Self-pay | Admitting: Pulmonary Disease

## 2021-04-25 DIAGNOSIS — J4551 Severe persistent asthma with (acute) exacerbation: Secondary | ICD-10-CM

## 2021-04-25 NOTE — Telephone Encounter (Signed)
Dupixent refill pending OV w [facility]l [facility]arrow on 04/26/21 ?

## 2021-04-26 ENCOUNTER — Ambulatory Visit: Payer: No Typology Code available for payment source | Admitting: Primary Care

## 2021-04-26 ENCOUNTER — Telehealth: Payer: Self-pay | Admitting: *Deleted

## 2021-04-26 ENCOUNTER — Other Ambulatory Visit: Payer: Self-pay

## 2021-04-26 ENCOUNTER — Ambulatory Visit (INDEPENDENT_AMBULATORY_CARE_PROVIDER_SITE_OTHER): Payer: No Typology Code available for payment source | Admitting: Primary Care

## 2021-04-26 ENCOUNTER — Encounter: Payer: Self-pay | Admitting: Primary Care

## 2021-04-26 DIAGNOSIS — J8283 Eosinophilic asthma: Secondary | ICD-10-CM | POA: Diagnosis not present

## 2021-04-26 DIAGNOSIS — J4551 Severe persistent asthma with (acute) exacerbation: Secondary | ICD-10-CM

## 2021-04-26 MED ORDER — DUPIXENT 300 MG/2ML ~~LOC~~ SOAJ: 300.0000 mg | SUBCUTANEOUS | 5 refills | Status: DC

## 2021-04-26 NOTE — Patient Instructions (Addendum)
PA for dupixent approved through March 2024 ? ?Recommendations: ?Continue Symbicort 40mg 1-2 puffs every 12 hours for shortness of breath/wheezing ?Use Albuterol every 4-6 hours for breakthrough symptoms  ?Continue Dupixent injections every two weeks as directed ? ?Follow-up: ?6 months with Dr. MVaughan Browner? ?Asthma, Adult ?Asthma is a long-term (chronic) condition in which the airways get tight and narrow. The airways are the breathing passages that lead from the nose and mouth down into the lungs. A person with asthma will have times when symptoms get worse. These are called asthma attacks. They can cause coughing, whistling sounds when you breathe (wheezing), shortness of breath, and chest pain. They can make it hard to breathe. There is no cure for asthma, but medicines and lifestyle changes can help control it. ?There are many things that can bring on an asthma attack or make asthma symptoms worse (triggers). Common triggers include: ?Mold. ?Dust. ?Cigarette smoke. ?Cockroaches. ?Things that can cause allergy symptoms (allergens). These include animal skin flakes (dander) and pollen from trees or grass. ?Things that pollute the air. These may include household cleaners, wood smoke, smog, or chemical odors. ?Cold air, weather changes, and wind. ?Crying or laughing hard. ?Stress. ?Certain medicines or drugs. ?Certain foods such as dried fruit, potato chips, and grape juice. ?Infections, such as a cold or the flu. ?Certain medical conditions or diseases. ?Exercise or tiring activities. ?Asthma may be treated with medicines and by staying away from the things that cause asthma attacks. Types of medicines may include: ?Controller medicines. These help prevent asthma symptoms. They are usually taken every day. ?Fast-acting reliever or rescue medicines. These quickly relieve asthma symptoms. They are used as needed and provide short-term relief. ?Allergy medicines if your attacks are brought on by allergens. ?Medicines to  help control the body's defense (immune) system. ?Follow these instructions at home: ?Avoiding triggers in your home ?Change your heating and air conditioning filter often. ?Limit your use of fireplaces and wood stoves. ?Get rid of pests (such as roaches and mice) and their droppings. ?Throw away plants if you see mold on them. ?Clean your floors. Dust regularly. Use cleaning products that do not smell. ?Have someone vacuum when you are not home. Use a vacuum cleaner with a HEPA filter if possible. ?Replace carpet with wood, tile, or vinyl flooring. Carpet can trap animal skin flakes and dust. ?Use allergy-proof pillows, mattress covers, and box spring covers. ?Wash bed sheets and blankets every week in hot water. Dry them in a dryer. ?Keep your bedroom free of any triggers. ?Avoid pets and keep windows closed when things that cause allergy symptoms are in the air. ?Use blankets that are made of polyester or cotton. ?Clean bathrooms and kitchens with bleach. If possible, have someone repaint the walls in these rooms with mold-resistant paint. Keep out of the rooms that are being cleaned and painted. ?Wash your hands often with soap and water. If soap and water are not available, use hand sanitizer. ?Do not allow anyone to smoke in your home. ?General instructions ?Take over-the-counter and prescription medicines only as told by your doctor. ?Talk with your doctor if you have questions about how or when to take your medicines. ?Make note if you need to use your medicines more often than usual. ?Do not use any products that contain nicotine or tobacco, such as cigarettes and e-cigarettes. If you need help quitting, ask your doctor. ?Stay away from secondhand smoke. ?Avoid doing things outdoors when allergen counts are high and when air quality is  low. ?Wear a ski mask when doing outdoor activities in the winter. The mask should cover your nose and mouth. Exercise indoors on cold days if you can. ?Warm up before you  exercise. Take time to cool down after exercise. ?Use a peak flow meter as told by your doctor. A peak flow meter is a tool that measures how well the lungs are working. ?Keep track of the peak flow meter's readings. Write them down. ?Follow your asthma action plan. This is a written plan for taking care of your asthma and treating your attacks. ?Make sure you get all the shots (vaccines) that your doctor recommends. Ask your doctor about a flu shot and a pneumonia shot. ?Keep all follow-up visits as told by your doctor. This is important. ?Contact a doctor if: ?You have wheezing, shortness of breath, or a cough even while taking medicine to prevent attacks. ?The mucus you cough up (sputum) is thicker than usual. ?The mucus you cough up changes from clear or white to yellow, green, gray, or bloody. ?You have problems from the medicine you are taking, such as: ?A rash. ?Itching. ?Swelling. ?Trouble breathing. ?You need reliever medicines more than 2-3 times a week. ?Your peak flow reading is still at 50-79% of your personal best after following the action plan for 1 hour. ?You have a fever. ?Get help right away if: ?You seem to be worse and are not responding to medicine during an asthma attack. ?You are short of breath even at rest. ?You get short of breath when doing very little activity. ?You have trouble eating, drinking, or talking. ?You have chest pain or tightness. ?You have a fast heartbeat. ?Your lips or fingernails start to turn blue. ?You are light-headed or dizzy, or you faint. ?Your peak flow is less than 50% of your personal best. ?You feel too tired to breathe normally. ?Summary ?Asthma is a long-term (chronic) condition in which the airways get tight and narrow. An asthma attack can make it hard to breathe. ?Asthma cannot be cured, but medicines and lifestyle changes can help control it. ?Make sure you understand how to avoid triggers and how and when to use your medicines. ?This information is not  intended to replace advice given to you by your health care provider. Make sure you discuss any questions you have with your health care provider. ?Document Revised: 05/11/2019 Document Reviewed: 05/21/2019 ?Elsevier Patient Education ? Fellows. ? ?

## 2021-04-26 NOTE — Assessment & Plan Note (Addendum)
-   Well controlled on present therapy. No recent exacerbations or hospitalizations. No SABA use. Continue Symbicort 80, prn albuterol and Dupixent '300mg'$  q 2 weeks (PA approved through March 2023).  ?

## 2021-04-26 NOTE — Progress Notes (Deleted)
? ?'@Patient'$  ID: [name], female    DOB: 06-Jul-1965, 56 y.o.   MRN: [id number] ? ?No chief complaint on file. ? ? ?Referring provider: ?[name], MD ? ?HPI: ?56 year old female, never smoked.  Past medical history significant for severe persistent asthma.  Patient of Dr. Vaughan Browner, last seen by pulmonary nurse practitioner for virtual video visit on 09/06/2020.  ? ?04/26/2021 ?Patient presents today for overdue follow-up. Maintained on Symbicort 160 and Dupixent.  ? ? ? ? ?Allergies  ?Allergen Reactions  ? Mold Extract [Trichophyton] Shortness Of Breath  ? Amoxicillin-Pot Clavulanate   ?  Closed up throat, could not swallow.  ? Clindamycin/Lincomycin   ? Depakote [Divalproex Sodium] Other (See Comments)  ?  Weight gain  ? Levaquin [Levofloxacin]   ? Other   ? Topamax [Topiramate] Other (See Comments)  ?  Muscle pain  ? Tegretol [Carbamazepine] Rash  ? ? ?Immunization History  ?Administered Date(s) Administered  ? DT (Pediatric) 01/17/2002  ? Influenza Inj Mdck Quad Pf 02/05/2018  ? Influenza Split 03/02/2012  ? Influenza,inj,Quad PF,6+ Mos 10/08/2018  ? PFIZER(Purple Top)SARS-COV-2 Vaccination 04/24/2019, 05/23/2019  ? Pneumococcal Polysaccharide-23 10/16/2014  ? ? ?Past Medical History:  ?Diagnosis Date  ? Anal fissure   ? Anxiety   ? Asthma   ? Fall of 2014 [facility])   ? Chronic headaches   ? PE (pulmonary embolism) 1991  ? After C-section  ? Pneumonia   ? PONV (postoperative nausea and vomiting)   ? Seizures ([place])   ? 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 Sinusitis   ? Skin cancer   ? basal cell  ? Tuberculosis   ? tested positive to exposure; xray clear  ? ? ?Tobacco History: ?Social History  ? ?Tobacco Use  ?Smoking Status Former  ? Packs/day: 1.00  ? Years: 5.00  ? Pack years: 5.00  ? Types: Cigarettes  ? Quit date: 01/31/1988  ? Years since quitting: 33.2  ?Smokeless Tobacco Never  ? ?Counseling given: Not Answered ? ? ?Outpatient Medications Prior to Visit  ?Medication Sig Dispense  Refill  ? ADDERALL XR 20 MG 24 hr capsule TK 1 C PO QD    ? albuterol (VENTOLIN HFA) 108 (90 Base) MCG/ACT inhaler Inhale 2 puffs into the lungs every 6 (six) hours as needed for wheezing or shortness of breath. 8 g 5  ? budesonide-formoterol (SYMBICORT) 80-4.5 MCG/ACT inhaler Inhale 2 puffs into the lungs 2 (two) times daily. 10.2 g 5  ? Dupilumab (DUPIXENT) 300 MG/2ML SOPN Inject 300 mg into the skin every 14 (fourteen) days. 4 mL 5  ? EPINEPHrine 0.3 mg/0.3 mL IJ SOAJ injection Inject 0.3 mLs (0.3 mg total) into the muscle once. 1 Device 1  ? FLUoxetine (PROZAC) 40 MG capsule Take 40 mg by mouth daily.    ? ibuprofen (ADVIL) 200 MG tablet Take 200 mg by mouth every 6 (six) hours as needed for headache or mild pain.    ? metoprolol succinate (TOPROL-XL) 25 MG 24 hr tablet Take 25 mg by mouth daily.    ? nabumetone (RELAFEN) 750 MG tablet TAKE 1 TABLET(750 MG) BY MOUTH TWICE DAILY AS NEEDED 60 tablet 6  ? oxybutynin (DITROPAN-XL) 10 MG 24 hr tablet Take 10 mg by mouth daily.     ? predniSONE (DELTASONE) 20 MG tablet Take 1 tablet (20 mg total) by mouth daily with breakfast. 5 tablet 0  ? ?No facility-administered medications prior to visit.  ? ? ? ? ?Review  of Systems ? ?Review of Systems ? ? ?Physical Exam ? ?There were no vitals taken for this visit. ?Physical Exam  ? ?Lab Results: ? ?CBC ?   ?Component Value Date/Time  ? WBC 10.1 05/30/2018 0431  ? RBC 3.29 (L) 05/30/2018 0431  ? HGB 10.7 (L) 05/30/2018 0431  ? HCT 32.8 (L) 05/30/2018 0431  ? PLT 176 05/30/2018 0431  ? MCV 99.7 05/30/2018 0431  ? MCH 32.5 05/30/2018 0431  ? MCHC 32.6 05/30/2018 0431  ? RDW 13.9 05/30/2018 0431  ? LYMPHSABS 1.1 05/28/2018 0850  ? MONOABS 1.1 (H) 05/28/2018 0850  ? EOSABS 0.3 05/28/2018 0850  ? BASOSABS 0.1 05/28/2018 0850  ? ? ?BMET ?   ?Component Value Date/Time  ? NA 138 05/30/2018 0431  ? NA 138 02/24/2014 1520  ? K 3.3 (L) 05/30/2018 0431  ? CL 107 05/30/2018 0431  ? CO2 24 05/30/2018 0431  ? GLUCOSE 100 (H) 05/30/2018 0431   ? BUN 7 05/30/2018 0431  ? BUN 8 02/24/2014 1520  ? CREATININE 0.57 05/30/2018 0431  ? CALCIUM 8.3 (L) 05/30/2018 0431  ? GFRNONAA >60 05/30/2018 0431  ? GFRAA >60 05/30/2018 0431  ? ? ?BNP ?No results found for: BNP ? ?ProBNP ?No results found for: PROBNP ? ?Imaging: ?No results found. ? ? ?Assessment & Plan:  ? ?No problem-specific Assessment & Plan notes found for this encounter. ? ? ? ? ?Martyn Ehrich, NP ?04/26/2021 ? ?

## 2021-04-26 NOTE — Telephone Encounter (Signed)
Patient is in the office today, she received a letter in the mail regarding the need for a PA for her [place].  Please advise.  Thank you. ?

## 2021-04-26 NOTE — Telephone Encounter (Signed)
Pt's PA was recently renewed, approved from 04/18/21 to 04/19/22. Nothing additional needed at this time. ?

## 2021-04-26 NOTE — Progress Notes (Signed)
? ?'@Patient'$  ID: Tammie Sanders, female    DOB: 1965-06-22, 56 y.o.   MRN: [id number] ? ?Chief Complaint  ?Patient presents with  ? Consult  ? ? ?Referring provider: ?[name], MD ? ?HPI: ?56 year old female, never smoked.  Past medical history significant for severe persistent asthma.  Patient of Dr. Vaughan Browner, last seen by pulmonary nurse practitioner for virtual video visit on 09/06/2020.  ? ?04/26/2021- Interim hx  ?Patient presents today for overdue follow-up. Maintained on Symbicort 80 and Dupixent. PA for Dupixent approved through March 2024. She is doing well. Compliant with all her medications. She is dealing with some minor sinus drainage. Symptoms have not flared up her asthma. She uses Symbicort as needed. No recent prednisone or SABA use. No hospitalizations. Denies cough, wheezing or chest tightness  ? ?Allergies  ?Allergen Reactions  ? Mold Extract [Trichophyton] Shortness Of Breath  ? Amoxicillin-Pot Clavulanate   ?  Closed up throat, could not swallow.  ? Clindamycin/Lincomycin   ? Depakote [Divalproex Sodium] Other (See Comments)  ?  Weight gain  ? Levaquin [Levofloxacin]   ? Other   ? Topamax [Topiramate] Other (See Comments)  ?  Muscle pain  ? Tegretol [Carbamazepine] Rash  ? ? ?Immunization History  ?Administered Date(s) Administered  ? DT (Pediatric) 01/17/2002  ? Influenza Inj Mdck Quad Pf 02/05/2018  ? Influenza Split 03/02/2012  ? Influenza,inj,Quad PF,6+ Mos 10/08/2018  ? PFIZER(Purple Top)SARS-COV-2 Vaccination 04/24/2019, 05/23/2019  ? Pneumococcal Polysaccharide-23 09/15/2014, 10/16/2014  ? ? ?Past Medical History:  ?Diagnosis Date  ? Anal fissure   ? Anxiety   ? Asthma   ? Fall of 2014 [facility])   ? Chronic headaches   ? PE (pulmonary embolism) 1991  ? After C-section  ? Pneumonia   ? PONV (postoperative nausea and vomiting)   ? Seizures ([place])   ? 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 Sinusitis   ? Skin cancer   ? basal cell  ? Tuberculosis   ? tested positive to  exposure; xray clear  ? ? ?Tobacco History: ?Social History  ? ?Tobacco Use  ?Smoking Status Former  ? Packs/day: 1.00  ? Years: 5.00  ? Pack years: 5.00  ? Types: Cigarettes  ? Quit date: 01/31/1988  ? Years since quitting: 33.2  ?Smokeless Tobacco Never  ? ?Counseling given: Not Answered ? ? ?Outpatient Medications Prior to Visit  ?Medication Sig Dispense Refill  ? albuterol (VENTOLIN HFA) 108 (90 Base) MCG/ACT inhaler Inhale 2 puffs into the lungs every 6 (six) hours as needed for wheezing or shortness of breath. 8 g 5  ? budesonide-formoterol (SYMBICORT) 80-4.5 MCG/ACT inhaler Inhale 2 puffs into the lungs 2 (two) times daily. 10.2 g 5  ? FLUoxetine (PROZAC) 40 MG capsule Take 40 mg by mouth daily.    ? losartan (COZAAR) 50 MG tablet TAKE 1 TABLET BY MOUTH EVERY DAY    ? metoprolol succinate (TOPROL-XL) 25 MG 24 hr tablet Take 25 mg by mouth daily.    ? oxybutynin (DITROPAN-XL) 10 MG 24 hr tablet Take 10 mg by mouth daily.     ? Dupilumab (DUPIXENT) 300 MG/2ML SOPN Inject 300 mg into the skin every 14 (fourteen) days. 4 mL 5  ? EPINEPHrine 0.3 mg/0.3 mL IJ SOAJ injection Inject 0.3 mLs (0.3 mg total) into the muscle once. (Patient not taking: Reported on 04/26/2021) 1 Device 1  ? ibuprofen (ADVIL) 200 MG tablet Take 200 mg by mouth every 6 (six) hours as needed  for headache or mild pain. (Patient not taking: Reported on 04/26/2021)    ? ADDERALL XR 20 MG 24 hr capsule TK 1 C PO QD (Patient not taking: Reported on 04/26/2021)    ? nabumetone (RELAFEN) 750 MG tablet TAKE 1 TABLET(750 MG) BY MOUTH TWICE DAILY AS NEEDED (Patient not taking: Reported on 04/26/2021) 60 tablet 6  ? predniSONE (DELTASONE) 20 MG tablet Take 1 tablet (20 mg total) by mouth daily with breakfast. (Patient not taking: Reported on 04/26/2021) 5 tablet 0  ? ?No facility-administered medications prior to visit.  ? ?Review of Systems ? ?Review of Systems  ?Constitutional: Negative.   ?Respiratory: Negative.    ?Psychiatric/Behavioral: Negative.     ? ? ?Physical Exam ? ?BP 124/80 (BP Location: Right Arm, Patient Position: Sitting, Cuff Size: Normal)   Pulse 85   Temp 98.5 ?F (36.9 ?C) (Oral)   Ht '5\' 4"'$  (1.626 m)   Wt 145 lb 6.4 oz (66 kg)   SpO2 98%   BMI 24.96 kg/m?  ?Physical Exam ?Constitutional:   ?   Appearance: Normal appearance.  ?HENT:  ?   Head: Normocephalic and atraumatic.  ?   Mouth/Throat:  ?   Mouth: Mucous membranes are moist.  ?   Pharynx: Oropharynx is clear.  ?Cardiovascular:  ?   Rate and Rhythm: Normal rate and regular rhythm.  ?Pulmonary:  ?   Effort: Pulmonary effort is normal.  ?   Breath sounds: Normal breath sounds.  ?Musculoskeletal:     ?   General: Normal range of motion.  ?Skin: ?   General: Skin is warm and dry.  ?Neurological:  ?   General: No focal deficit present.  ?   Mental Status: She is alert and oriented to person, place, and time. Mental status is at baseline.  ?Psychiatric:     ?   Mood and Affect: Mood normal.     ?   Behavior: Behavior normal.     ?   Thought Content: Thought content normal.     ?   Judgment: Judgment normal.  ?  ? ?Lab Results: ? ?CBC ?   ?Component Value Date/Time  ? WBC 10.1 05/30/2018 0431  ? RBC 3.29 (L) 05/30/2018 0431  ? HGB 10.7 (L) 05/30/2018 0431  ? HCT 32.8 (L) 05/30/2018 0431  ? PLT 176 05/30/2018 0431  ? MCV 99.7 05/30/2018 0431  ? MCH 32.5 05/30/2018 0431  ? MCHC 32.6 05/30/2018 0431  ? RDW 13.9 05/30/2018 0431  ? LYMPHSABS 1.1 05/28/2018 0850  ? MONOABS 1.1 (H) 05/28/2018 0850  ? EOSABS 0.3 05/28/2018 0850  ? BASOSABS 0.1 05/28/2018 0850  ? ? ?BMET ?   ?Component Value Date/Time  ? NA 138 05/30/2018 0431  ? NA 138 02/24/2014 1520  ? K 3.3 (L) 05/30/2018 0431  ? CL 107 05/30/2018 0431  ? CO2 24 05/30/2018 0431  ? GLUCOSE 100 (H) 05/30/2018 0431  ? BUN 7 05/30/2018 0431  ? BUN 8 02/24/2014 1520  ? CREATININE 0.57 05/30/2018 0431  ? CALCIUM 8.3 (L) 05/30/2018 0431  ? GFRNONAA >60 05/30/2018 0431  ? GFRAA >60 05/30/2018 0431  ? ? ?BNP ?No results found for: BNP ? ?ProBNP ?No results  found for: PROBNP ? ?Imaging: ?No results found. ? ? ?Assessment & Plan:  ? 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 No recent exacerbations or hospitalizations. No SABA use. Continue Symbicort 80, prn albuterol and Dupixent '300mg'$  q 2 weeks (PA approved through March 2023).  ? ? ? ? ?  Martyn Ehrich, NP ?04/26/2021 ? ?

## 2021-04-27 NOTE — Telephone Encounter (Signed)
Refill sent for [place] to [facility]: [phone]  ? ?Dose: 300 mg SQ every 2 weeks ? ?Last OV: 04/26/21 ?Provider: Derl Barrow, NP and Dr. Vaughan Browner ? ?Next OV: 6 months, not yet scheduled ? ?Knox Saliva, PharmD, MPH, BCPS ?Clinical Pharmacist (Rheumatology and Pulmonology) ? ?

## 2021-11-03 ENCOUNTER — Telehealth: Payer: Self-pay | Admitting: Primary Care

## 2021-11-03 DIAGNOSIS — J4551 Severe persistent asthma with (acute) exacerbation: Secondary | ICD-10-CM

## 2021-11-03 NOTE — Telephone Encounter (Signed)
Refill sent for DUPIXENT to [place]: [phone]   Dose: 300 mg every 2 weeks  Last OV: 04/26/21 Provider: Dr. Vaughan Browner  Next OV: not scheduled and OVERDUE for appt. Routing to scheduling team  Tammie Sanders, PharmD, MPH, BCPS Clinical Pharmacist (Rheumatology and Pulmonology)

## 2021-11-08 NOTE — Telephone Encounter (Signed)
Pt scheduled for 11/7 w [facility] for f/u appt. Nothing further needed.

## 2021-11-23 ENCOUNTER — Encounter: Payer: Self-pay | Admitting: Internal Medicine

## 2021-11-30 NOTE — Progress Notes (Unsigned)
Cardiology Office Note:    Date:  12/01/2021   ID:  Tammie Sanders, DOB [birth year], MRN [id number]  PCP:  [name], MD   [facility] Providers Cardiologist:  Lenna Sciara, MD Referring MD: [name], MD   Chief Complaint/Reason for Referral:  Chest pain  ASSESSMENT:    1. Precordial pain   2. Primary hypertension   3. Pre-procedure lab exam     PLAN:    In order of problems listed above: 1.  Chest pain:   We will start Imdur 30 mg at bedtime and prescribed as needed nitroglycerin.  Continue Toprol.  We will obtain a coronary CTA and echocardiogram to evaluate further.  If the patient has mild obstructive coronary artery disease, they will require a statin (with goal LDL < 70) and aspirin, if they have high-grade disease we will need to consider optimal medical therapy and if symptoms are refractory to medical therapy, then a cardiac catheterization with possible PCI will be pursued to alleviate symptoms.  If they have high risk disease we will proceed directly to cardiac catheterization.   2.  Hypertension: Increase losartan to 75 mg given elevated diastolic blood pressure.             Dispo:  Return in about 3 months (around 03/03/2022).      Medication Adjustments/Labs and Tests Ordered: Current medicines are reviewed at length with the patient today.  Concerns regarding medicines are outlined above.  The following changes have been made:     Labs/tests ordered: Orders Placed This Encounter  Procedures   CT CORONARY MORPH W/CTA COR W/SCORE W/CA W/CM &/OR WO/CM   Basic Metabolic Panel (BMET)   EKG 12-Lead   ECHOCARDIOGRAM COMPLETE    Medication Changes: Meds ordered this encounter  Medications   metoprolol tartrate (LOPRESSOR) 100 MG tablet    Sig: Take 1 tablet (100 mg total) by mouth once for 1 dose. Take 90-120 minutes prior to scan.    Dispense:  1 tablet    Refill:  0   isosorbide mononitrate (IMDUR) 30 MG 24 hr tablet    Sig: Take 1 tablet (30  mg total) by mouth at bedtime.    Dispense:  90 tablet    Refill:  3   nitroGLYCERIN (NITROSTAT) 0.4 MG SL tablet    Sig: Place 1 tablet (0.4 mg total) under the tongue every 5 (five) minutes as needed for chest pain.    Dispense:  25 tablet    Refill:  3   losartan (COZAAR) 50 MG tablet    Sig: Take 1.5 tablets (75 mg total) by mouth daily.    Dispense:  135 tablet    Refill:  3    Dose increase     Current medicines are reviewed at length with the patient today.  The patient does not have concerns regarding medicines.   History of Present Illness:    FOCUSED PROBLEM LIST:   1.  Severe persistent asthma 2.  Hypertension 3.  Anxiety  The patient is a 56 y.o. female with the indicated medical history here for chest pain.  The patient was seen at her PCPs office recently.  Apparently she had been having chest pain for 6 months.  She describes a constant chest tightness and feelings of indigestion.  She has endorsed some shortness of breath and fatigue with more strenuous activity.  She states she is been very stressed, tired, and not sleeping very well.  The patient tells me that she has  developed exertional angina.  This happens when she exerts herself moderately.  She will develop a central chest pressure.  When she stops and rests pressure will go away however she has discomfort that lasts several hours if not days.  She denies any shortness of breath and related to asthma with these episodes.  She denies any bleeding or bruising, paroxysmal atrial dyspnea, orthopnea, or severe peripheral edema.  She required no hospitalizations or emergency room visits in the last few months.  She has had to curtail her activity quite a bit due to the symptoms.          Current Medications: Current Meds  Medication Sig   albuterol (VENTOLIN HFA) 108 (90 Base) MCG/ACT inhaler Inhale 2 puffs into the lungs every 6 (six) hours as needed for wheezing or shortness of breath.   budesonide-formoterol  (SYMBICORT) 80-4.5 MCG/ACT inhaler Inhale 2 puffs into the lungs 2 (two) times daily.   cetirizine (ZYRTEC) 10 MG tablet Take 10 mg by mouth daily.   Dupilumab (DUPIXENT) 300 MG/2ML SOPN INJECT '300MG'$  SUBCUTANEOUSLY  EVERY OTHER WEEK   EPINEPHrine 0.3 mg/0.3 mL IJ SOAJ injection Inject 0.3 mLs (0.3 mg total) into the muscle once.   FLUoxetine (PROZAC) 40 MG capsule Take 40 mg by mouth daily.   isosorbide mononitrate (IMDUR) 30 MG 24 hr tablet Take 1 tablet (30 mg total) by mouth at bedtime.   losartan (COZAAR) 50 MG tablet Take 1.5 tablets (75 mg total) by mouth daily.   Melatonin 3 MG CAPS Take by mouth.   metoprolol succinate (TOPROL-XL) 25 MG 24 hr tablet Take 25 mg by mouth daily.   metoprolol tartrate (LOPRESSOR) 100 MG tablet Take 1 tablet (100 mg total) by mouth once for 1 dose. Take 90-120 minutes prior to scan.   nitroGLYCERIN (NITROSTAT) 0.4 MG SL tablet Place 1 tablet (0.4 mg total) under the tongue every 5 (five) minutes as needed for chest pain.   oxybutynin (DITROPAN-XL) 10 MG 24 hr tablet Take 10 mg by mouth daily.    [DISCONTINUED] losartan (COZAAR) 50 MG tablet TAKE 1 TABLET BY MOUTH EVERY DAY     Allergies:    Mold extract [trichophyton], Amoxicillin-pot clavulanate, Clindamycin/lincomycin, Depakote [divalproex sodium], Levaquin [levofloxacin], Other, Topamax [topiramate], and Tegretol [carbamazepine]   Social History:   Social History   Tobacco Use   Smoking status: Former    Packs/day: 1.00    Years: 5.00    Total pack years: 5.00    Types: Cigarettes    Quit date: 01/31/1988    Years since quitting: 33.8   Smokeless tobacco: Never  Vaping Use   Vaping Use: Never used  Substance Use Topics   Alcohol use: Yes    Alcohol/week: 1.0 - 2.0 standard drink of alcohol    Types: 1 - 2 Standard drinks or equivalent per week    Comment: social   Drug use: No     Family Hx: Family History  Problem Relation Age of Onset   Diabetes Father    Cancer Father        skin    Arthritis Father    Heart disease Father    Asthma Mother    Rheum arthritis Mother    Arthritis Mother    Heart disease Mother    Other Mother        hypoglycemia   Irritable bowel syndrome Daughter    Cancer Maternal Grandfather        skin   Melanoma Maternal Grandfather  Colon cancer Neg Hx    Esophageal cancer Neg Hx    Rectal cancer Neg Hx    Stomach cancer Neg Hx      Review of Systems:   Please see the history of present illness.    All other systems reviewed and are negative.     EKGs/Labs/Other Test Reviewed:    EKG:  EKG performed 2016 that I personally reviewed demonstrates sinus rhythm; EKG performed today that I personally reviewed demonstrates sinus rhythm.  Prior CV studies: None available  Other studies Reviewed: Review of the additional studies/records demonstrates: No imaging evidence of aortic atherosclerosis or coronary artery calcification available  Recent Labs: No results found for requested labs within last 365 days.   Recent Lipid Panel No results found for: "CHOL", "TRIG", "HDL", "[place]", "LDLDIRECT"  Risk Assessment/Calculations:           HYPERTENSION CONTROL Vitals:   12/01/21 1543 12/01/21 1603  BP: (!) 120/94 (!) 135/95    The patient's blood pressure is elevated above target today.  In order to address the patient's elevated BP: A current anti-hypertensive medication was adjusted today.       Physical Exam:    VS:  BP (!) 135/95   Pulse 68   Ht '5\' 4"'$  (1.626 m)   Wt 144 lb 9.6 oz (65.6 kg)   SpO2 98%   BMI 24.82 kg/m    Wt Readings from Last 3 Encounters:  12/01/21 144 lb 9.6 oz (65.6 kg)  04/26/21 145 lb 6.4 oz (66 kg)  05/05/20 144 lb 9.6 oz (65.6 kg)    GENERAL:  No apparent distress, AOx3 HEENT:  No carotid bruits, +2 carotid impulses, no scleral icterus CAR: RRR no murmurs, gallops, rubs, or thrills RES:  Clear to auscultation bilaterally ABD:  Soft, nontender, nondistended, positive bowel sounds x  4 VASC:  +2 radial pulses, +2 carotid pulses, palpable pedal pulses NEURO:  CN 2-12 grossly intact; motor and sensory grossly intact PSYCH:  No active depression or anxiety EXT:  No edema, ecchymosis, or cyanosis  Signed, Early Osmond, MD  12/01/2021 4:28 PM    [address], [place], [place]  [address] Phone: [phone]; Fax: [phone]   Note:  This document was prepared using Dragon voice recognition software and may include unintentional dictation errors.

## 2021-12-01 ENCOUNTER — Encounter: Payer: Self-pay | Admitting: Internal Medicine

## 2021-12-01 ENCOUNTER — Ambulatory Visit: Payer: No Typology Code available for payment source | Attending: Internal Medicine | Admitting: Internal Medicine

## 2021-12-01 VITALS — BP 135/95 | HR 68 | Ht 64.0 in | Wt 144.6 lb

## 2021-12-01 DIAGNOSIS — I10 Essential (primary) hypertension: Secondary | ICD-10-CM | POA: Diagnosis not present

## 2021-12-01 DIAGNOSIS — R072 Precordial pain: Secondary | ICD-10-CM | POA: Diagnosis not present

## 2021-12-01 DIAGNOSIS — Z01812 Encounter for preprocedural laboratory examination: Secondary | ICD-10-CM | POA: Diagnosis not present

## 2021-12-01 MED ORDER — NITROGLYCERIN 0.4 MG SL SUBL: 0.4000 mg | SUBLINGUAL_TABLET | SUBLINGUAL | 3 refills | Status: AC | PRN

## 2021-12-01 MED ORDER — METOPROLOL TARTRATE 100 MG PO TABS: 100.0000 mg | ORAL_TABLET | Freq: Once | ORAL | 0 refills | Status: DC

## 2021-12-01 MED ORDER — LOSARTAN POTASSIUM 50 MG PO TABS: 75.0000 mg | ORAL_TABLET | Freq: Every day | ORAL | 3 refills | Status: DC

## 2021-12-01 MED ORDER — ISOSORBIDE MONONITRATE ER 30 MG PO TB24: 30.0000 mg | ORAL_TABLET | Freq: Every day | ORAL | 3 refills | Status: DC

## 2021-12-01 NOTE — Patient Instructions (Addendum)
Medication Instructions:  Your physician has recommended you make the following change in your medication:   1.) start isosorbide (IMDUR) 30 mg - take one tablet daily at bedtime 2.) nitroglycerin 0.4 mg - to be placed under your tongue for chest pain/discomfort/tightness     If a single episode of chest pain is not relieved by one tablet, the patient will try another within 5 minutes; and if this doesn't relieve the pain, the patient will try another within 5 minutes and if this doesn't relieve the pain the patient is instructed to call 911 for transportation to an emergency department.   *If you need a refill on your cardiac medications before your next appointment, please call your pharmacy*   Lab Work: Today: BMET   Testing/Procedures: Your physician has requested that you have an echocardiogram. Echocardiography is a painless test that uses sound waves to create images of your heart. It provides your doctor with information about the size and shape of your heart and how well your heart's chambers and valves are working. This procedure takes approximately one hour. There are no restrictions for this procedure. Please do NOT wear cologne, perfume, aftershave, or lotions (deodorant is allowed). Please arrive 15 minutes prior to your appointment time.  Cardiac CT - see instructions below  Follow-Up: At [facility], you and your health needs are our priority.  As part of our continuing mission to provide you with exceptional heart care, we have created designated Provider Care Teams.  These Care Teams include your primary Cardiologist (physician) and Advanced Practice Providers (APPs -  Physician Assistants and Nurse Practitioners) who all work together to provide you with the care you need, when you need it.  We recommend signing up for the patient portal called "MyChart".  Sign up information is provided on this After Visit Summary.  MyChart is used to connect with patients for  Virtual Visits (Telemedicine).  Patients are able to view lab/test results, encounter notes, upcoming appointments, etc.  Non-urgent messages can be sent to your provider as well.   To learn more about what you can do with MyChart, go to [email or web address].    Your next appointment:   3 month(s)  The format for your next appointment:   In Person  Provider:   Early Osmond, MD     Other Instructions   Your cardiac CT will be scheduled at  [facility] [place], [place] [address] [phone]  Please arrive at the [facility] Entrance (Entrance C2) of [facility] 30 minutes prior to test start time. You can use the FREE valet parking offered at entrance C (encouraged to control the heart rate for the test)  Proceed to the [facility] Radiology Department (first floor) to check-in and test prep.  All radiology patients and guests should use entrance C2 at [facility], accessed from [facility], even though the hospital's physical address listed is [address].     Please follow these instructions carefully (unless otherwise directed):  On the Night Before the Test: Be sure to Drink plenty of water. Do not consume any caffeinated/decaffeinated beverages or chocolate 12 hours prior to your test. Do not take any antihistamines 12 hours prior to your test.  On the Day of the Test: Drink plenty of water until 1 hour prior to the test. Do not eat any food 1 hour prior to test. You may take your regular medications prior to the  test EXCEPT HOLD TOPROL.  Take metoprolol (Lopressor) 100 mg two hours prior to test. FEMALES- please wear underwire-free bra if available, avoid dresses & tight clothing  After the Test: Drink plenty of water. After receiving IV contrast, you may experience a mild flushed feeling. This is normal. On occasion, you may experience a mild rash up to 24 hours after the test.  This is not dangerous. If this occurs, you can take Benadryl 25 mg and increase your fluid intake. If you experience trouble breathing, this can be serious. If it is severe call 911 IMMEDIATELY. If it is mild, please call our office. If you take any of these medications: Glipizide/Metformin, Avandament, Glucavance, please do not take 48 hours after completing test unless otherwise instructed.  We will call to schedule your test 2-4 weeks out understanding that some insurance companies will need an authorization prior to the service being performed.   For non-scheduling related questions, please contact the cardiac imaging nurse navigator should you have any questions/concerns: Marchia Bond, Cardiac Imaging Nurse Navigator Gordy Clement, Cardiac Imaging Nurse Navigator [facility] Direct Office Dial: [phone]   For scheduling needs, including cancellations and rescheduling, please call [place], [phone].   Important Information About Sugar

## 2021-12-06 ENCOUNTER — Emergency Department (HOSPITAL_BASED_OUTPATIENT_CLINIC_OR_DEPARTMENT_OTHER): Payer: No Typology Code available for payment source

## 2021-12-06 ENCOUNTER — Telehealth: Payer: Self-pay | Admitting: Internal Medicine

## 2021-12-06 ENCOUNTER — Encounter (HOSPITAL_BASED_OUTPATIENT_CLINIC_OR_DEPARTMENT_OTHER): Payer: Self-pay

## 2021-12-06 ENCOUNTER — Ambulatory Visit: Payer: No Typology Code available for payment source | Admitting: Pulmonary Disease

## 2021-12-06 ENCOUNTER — Emergency Department (HOSPITAL_BASED_OUTPATIENT_CLINIC_OR_DEPARTMENT_OTHER)
Admission: EM | Admit: 2021-12-06 | Discharge: 2021-12-06 | Discharge status: Against Medical Advice | Payer: No Typology Code available for payment source | Attending: Emergency Medicine | Admitting: Emergency Medicine

## 2021-12-06 DIAGNOSIS — Z5321 Procedure and treatment not carried out due to patient leaving prior to being seen by health care provider: Secondary | ICD-10-CM | POA: Diagnosis not present

## 2021-12-06 DIAGNOSIS — R519 Headache, unspecified: Secondary | ICD-10-CM | POA: Diagnosis present

## 2021-12-06 DIAGNOSIS — I10 Essential (primary) hypertension: Secondary | ICD-10-CM | POA: Insufficient documentation

## 2021-12-06 LAB — BASIC METABOLIC PANEL
Anion gap: 12 (ref 5–15)
BUN: 19 mg/dL (ref 6–20)
CO2: 24 mmol/L (ref 22–32)
Calcium: 9.6 mg/dL (ref 8.9–10.3)
Chloride: 102 mmol/L (ref 98–111)
Creatinine, Ser: 0.76 mg/dL (ref 0.44–1.00)
GFR, Estimated: >60 mL/min (ref >60)
Glucose, Bld: 110 mg/dL — ABNORMAL HIGH (ref 70–99)
Potassium: 3.7 mmol/L (ref 3.5–5.1)
Sodium: 138 mmol/L (ref 135–145)

## 2021-12-06 LAB — CBC WITH DIFFERENTIAL/PLATELET
Abs Immature Granulocytes: 0.02 10*3/uL (ref 0.00–0.07)
Basophils Absolute: 0.1 10*3/uL (ref 0.0–0.1)
Basophils Relative: 1 %
Eosinophils Absolute: 0.1 10*3/uL (ref 0.0–0.5)
Eosinophils Relative: 2 %
HCT: 39.3 % (ref 36.0–46.0)
Hemoglobin: 13.4 g/dL (ref 12.0–15.0)
Immature Granulocytes: 0 %
Lymphocytes Relative: 39 %
Lymphs Abs: 2.1 10*3/uL (ref 0.7–4.0)
MCH: 32.1 pg (ref 26.0–34.0)
MCHC: 34.1 g/dL (ref 30.0–36.0)
MCV: 94 fL (ref 80.0–100.0)
Monocytes Absolute: 0.5 10*3/uL (ref 0.1–1.0)
Monocytes Relative: 10 %
Neutro Abs: 2.5 10*3/uL (ref 1.7–7.7)
Neutrophils Relative %: 48 %
Platelets: 234 10*3/uL (ref 150–400)
RBC: 4.18 MIL/uL (ref 3.87–5.11)
RDW: 12.7 % (ref 11.5–15.5)
WBC: 5.4 10*3/uL (ref 4.0–10.5)
nRBC: 0 % (ref 0.0–0.2)

## 2021-12-06 MED ORDER — LOSARTAN POTASSIUM 100 MG PO TABS: 100.0000 mg | ORAL_TABLET | Freq: Every day | ORAL | 11 refills | Status: DC

## 2021-12-06 NOTE — ED Notes (Signed)
Spoke with MD about pt's condition. Orders placed accordingly.

## 2021-12-06 NOTE — Telephone Encounter (Signed)
Dr. Ali Lowe recommended stopping Imdur, increasing Losartan to 100 mg daily and referral to PharmD hypertension clinic.  Patient verbalized understanding and agreement.

## 2021-12-06 NOTE — Telephone Encounter (Signed)
The patient is talking about her new imdur she started last week and it is causing headaches every night and waking her up. Patient states throughout the day as her headache goes away her BP comes down to ~140/90.  Today 172/105 - 7 am, headache is now a 6 on the pain scale, she took tylenol around 1 am and 50 mg extra of metoprolol. She went to the ER got some blood work and a head CT. Patient stated she could not stay in the ER after being there for 4 hours with no medication to help her. Along with the bright lights causing her head to hurt worse and make her nauseous, so she when home and went back to bed. When she got up at 7 am she took more tylenol for her headache. Current BP 182/114 HR 69. Advised the patient to take normal daily dose of Losartan 75 mg.

## 2021-12-06 NOTE — ED Triage Notes (Signed)
Pt presents to the ED with husband with hypertension and severe headache. States that she has recently been started on imdur for her BP and states that it has been giving her terrible headaches. States that pta her BP was 200/120. She took '50mg'$  metoprolol pta. BP at time of triage 177/90. Denies CP or SHOB.

## 2021-12-06 NOTE — Telephone Encounter (Signed)
Pt c/o BP issue: STAT if pt c/o blurred vision, one-sided weakness or slurred speech  1. What are your last 5 BP readings?   197/21 (1:30 am) 177/101 (7:00am) 177/91 (at ER)   2. Are you having any other symptoms (ex. Dizziness, headache, blurred vision, passed out)?  Bad headache, black spots in front of her eyes, could feel her heart beat in her ears  3. What is your BP issue?   Patient stated she woke up at 1:30 am with a bad headache.  Patient stated she went to the ER.  Patient stated she took an additional dose of metoprolol tartrate (LOPRESSOR) 100 MG tablet (Expired) .

## 2021-12-12 ENCOUNTER — Telehealth: Payer: Self-pay | Admitting: Pulmonary Disease

## 2021-12-12 DIAGNOSIS — J4551 Severe persistent asthma with (acute) exacerbation: Secondary | ICD-10-CM

## 2021-12-13 MED ORDER — DUPIXENT 300 MG/2ML ~~LOC~~ SOAJ: SUBCUTANEOUS | 0 refills | Status: DC

## 2021-12-13 NOTE — Telephone Encounter (Signed)
Refill sent for [place] to [facility] [place]: [phone]   Dose: 300 mg SQ every 2 weeks  Last OV: 04/26/21 Provider: Dr. Vaughan Browner  Next OV: 01/02/2022  Knox Saliva, PharmD, MPH, BCPS Clinical Pharmacist (Rheumatology and Pulmonology)

## 2021-12-15 ENCOUNTER — Telehealth (HOSPITAL_COMMUNITY): Payer: Self-pay | Admitting: *Deleted

## 2021-12-15 NOTE — Telephone Encounter (Signed)
Reaching out to patient to offer assistance regarding upcoming cardiac imaging study; pt verbalizes understanding of appt date/time, parking situation and where to check in, medications ordered, and verified current allergies; name and call back number provided for further questions should they arise  Laisha Rau RN Navigator Cardiac Imaging [facility] [phone] office [phone] cell  Patient to take 100mg metoprolol tartrate two hours prior to her cardiac CT scan. She is aware to arrive at 9:30am. 

## 2021-12-16 ENCOUNTER — Ambulatory Visit (HOSPITAL_COMMUNITY)
Admission: RE | Admit: 2021-12-16 | Discharge: 2021-12-16 | Disposition: A | Discharge status: Home / Self Care | Payer: No Typology Code available for payment source | Source: Ambulatory Visit | Attending: Internal Medicine | Admitting: Internal Medicine

## 2021-12-16 DIAGNOSIS — R072 Precordial pain: Secondary | ICD-10-CM | POA: Diagnosis present

## 2021-12-16 MED ORDER — NITROGLYCERIN 0.4 MG SL SUBL
SUBLINGUAL_TABLET | SUBLINGUAL | Status: AC
  Filled 2021-12-16: qty 2

## 2021-12-16 MED ORDER — IOHEXOL 350 MG/ML SOLN
100.0000 mL | Freq: Once | INTRAVENOUS | Status: AC | PRN
  Administered 2021-12-16: 100 mL via INTRAVENOUS

## 2021-12-16 MED ORDER — NITROGLYCERIN 0.4 MG SL SUBL
0.8000 mg | SUBLINGUAL_TABLET | Freq: Once | SUBLINGUAL | Status: AC
  Administered 2021-12-16: 0.8 mg via SUBLINGUAL

## 2021-12-19 ENCOUNTER — Ambulatory Visit (HOSPITAL_COMMUNITY): Payer: No Typology Code available for payment source | Attending: Cardiology

## 2021-12-19 DIAGNOSIS — I10 Essential (primary) hypertension: Secondary | ICD-10-CM | POA: Diagnosis not present

## 2021-12-19 DIAGNOSIS — R079 Chest pain, unspecified: Secondary | ICD-10-CM | POA: Diagnosis present

## 2021-12-19 DIAGNOSIS — R072 Precordial pain: Secondary | ICD-10-CM

## 2021-12-19 LAB — ECHOCARDIOGRAM COMPLETE
Area-P 1/2: 4.29 cm2
S' Lateral: 2.3 cm

## 2021-12-19 MED ORDER — PERFLUTREN LIPID MICROSPHERE
1.0000 mL | INTRAVENOUS | Status: AC | PRN
  Administered 2021-12-19: 2 mL via INTRAVENOUS

## 2022-01-02 ENCOUNTER — Encounter: Payer: Self-pay | Admitting: Pulmonary Disease

## 2022-01-02 ENCOUNTER — Other Ambulatory Visit: Payer: Self-pay | Admitting: Pharmacist

## 2022-01-02 ENCOUNTER — Ambulatory Visit (INDEPENDENT_AMBULATORY_CARE_PROVIDER_SITE_OTHER): Payer: No Typology Code available for payment source | Admitting: Pulmonary Disease

## 2022-01-02 VITALS — BP 130/64 | HR 72 | Temp 98.2°F | Ht 64.0 in | Wt 145.4 lb

## 2022-01-02 DIAGNOSIS — J4551 Severe persistent asthma with (acute) exacerbation: Secondary | ICD-10-CM

## 2022-01-02 DIAGNOSIS — J8283 Eosinophilic asthma: Secondary | ICD-10-CM | POA: Diagnosis not present

## 2022-01-02 MED ORDER — DUPIXENT 300 MG/2ML ~~LOC~~ SOAJ: SUBCUTANEOUS | 3 refills | Status: DC

## 2022-01-02 NOTE — Telephone Encounter (Signed)
Refill sent for DUPIXENT to [facility]: [id number]   Dose: 300 mg SQ every 2 weeks  Last OV: 01/02/2022 Provider: Dr. Vaughan Browner  Next OV:  10 months (not yet scheduled)  Knox Saliva, PharmD, MPH, BCPS Clinical Pharmacist (Rheumatology and Pulmonology)

## 2022-01-02 NOTE — Progress Notes (Signed)
Rx for Dupixent sent to [facility] x 1 year per patient and provider request  Knox Saliva, PharmD, MPH, BCPS, CPP Clinical Pharmacist (Rheumatology and Pulmonology)

## 2022-01-02 NOTE — Patient Instructions (Signed)
I am glad you are doing well with regard to breathing.   Continue the Dupixent.  I will contact pharmacy to send in renewal.   You can use Symbicort 1 puff twice daily as symptoms are well-controlled Follow-up in 10 months.

## 2022-01-02 NOTE — Progress Notes (Signed)
Tammie Sanders    [id number]    [birth year]  Primary Care Physician:Miller, Lattie Haw, MD  Referring Physician: [name], MD [place],  [place] [address]   Problem list: Severe persistent asthma Positive ANA.  No evidence of ILD on HRCT in 2020  HPI: 56 y.o.  with history of asthma, PE, seizures Previously evaluated in the pulmonary clinic around 2014 for cough which was felt to be upper respiratory.  She is evaluated for allergies with negative RAST panel and negative IgE  Had significant mold exposure in fall 2014.  Since then she has had ongoing issues with cough, shortness of breath, chest tightness, wheezing.  The only thing that is worked in the past is prednisone She is been tried on multiple inhalers including Symbicort, Spiriva, Singulair.  Evaluated by Dr. Fredderick Phenix at allergy.  Allergy testing showed sensitivity to only mold   She also has a positive ANA in 2020.  Evaluated by Dr. Trudie Reed and not felt to have any autoimmune disease HRCT with no ILD  Pets: Cat and a dog Occupation: Works as a Careers information officer.  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  No ongoing exposure.  No mold, hot tub, Jacuzzi Smoking history: 5-pack-year smoker.  Quit in 1990. Travel history: No significant travel history Relevant family history: Mother has asthma, rheumatoid arthritis and lupus.  Interval history: Started on Dupixent in March 2020 with remarkable improvement in symptoms She states that her life is back to normal now and the therapy is a Sports administrator  Continues to use Symbicort.  Stopped Singulair, Spiriva  Was evaluated by cardiology for angina with CT coronary showing coronary calcium of 0 and normal lungs.  Outpatient Encounter Medications as of 01/02/2022  Medication Sig   albuterol (VENTOLIN HFA) 108 (90 Base) MCG/ACT inhaler Inhale 2 puffs into the lungs every 6 (six) hours as needed for wheezing  or shortness of breath.   budesonide-formoterol (SYMBICORT) 80-4.5 MCG/ACT inhaler Inhale 2 puffs into the lungs 2 (two) times daily.   cetirizine (ZYRTEC) 10 MG tablet Take 10 mg by mouth daily.   Dupilumab (DUPIXENT) 300 MG/2ML SOPN INJECT '300MG'$  SUBCUTANEOUSLY  EVERY OTHER WEEK   FLUoxetine (PROZAC) 40 MG capsule Take 40 mg by mouth daily.   losartan (COZAAR) 100 MG tablet Take 1 tablet (100 mg total) by mouth daily.   Melatonin 3 MG CAPS Take by mouth.   metoprolol succinate (TOPROL-XL) 50 MG 24 hr tablet Take 50 mg by mouth daily.   nitroGLYCERIN (NITROSTAT) 0.4 MG SL tablet Place 1 tablet (0.4 mg total) under the tongue every 5 (five) minutes as needed for chest pain.   oxybutynin (DITROPAN-XL) 10 MG 24 hr tablet Take 10 mg by mouth daily.    EPINEPHrine 0.3 mg/0.3 mL IJ SOAJ injection Inject 0.3 mLs (0.3 mg total) into the muscle once. (Patient not taking: Reported on 01/02/2022)   No facility-administered encounter medications on file as of 01/02/2022.   Physical Exam: Blood pressure 130/64, pulse 72, temperature 98.2 F (36.8 C), temperature source Oral, height '5\' 4"'$  (1.626 m), weight 145 lb 6.4 oz (66 kg), SpO2 98 %. Gen:      No acute distress HEENT:  EOMI, sclera anicteric Neck:     No masses; no thyromegaly Lungs:    Clear to auscultation bilaterally; normal respiratory effort CV:         Regular rate and rhythm;  no murmurs Abd:      + bowel sounds; soft, non-tender; no palpable masses, no distension Ext:    No edema; adequate peripheral perfusion Skin:      Warm and dry; no rash Neuro: alert and oriented x 3 Psych: normal mood and affect   Data Reviewed: Imaging: Chest x-ray 07/24/2012-no acute cardiopulmonary abnormality. HRCT 04/11/2018-normal test with no ILD CT coronaries 12/16/2021-visualized lungs did not show any abnormality. I have reviewed the images personally  PFTs: 07/26/2018 FVC 3.32 [97%], FEV1 2.64 [98%], F/F 80, TLC 5.34 [107%], DLCO 23.82  [116%]  11/20/2019 FVC 2.99 [88%], FEV1 2.41 [91%], F/F81, TLC 5.48 [110%], DLCO 21.77 [107%] Minimal obstructive airway disease with bronchodilator response  FENO 03/21/2018-103  ACT score  05/05/2020- 25 01/02/2022- 24  Labs: CBC 05/28/2018-WBC 19.5, eos 1%, absolute eosinophil count 194 IgE 03/21/2018-40  ANA 03/21/2018-1:320, nuclear  Assessment:  Severe persistent asthma Improved with Dupixent Continue Symbicort 80/4.5.  De-escalate dose to 1 puff twice daily She will use Symbicort as a rescue inhaler as well  Positive ANA Previous HRCT and recent coronary CT with no ILD No evidence of autoimmune disease per rheumatology evaluation  Plan/Recommendations: - Continue Dupixent, Symbicort  Follow-up in 10 months.  Marshell Garfinkel MD [facility] 01/02/2022, 2:47 PM  CC: [name], MD

## 2022-01-18 ENCOUNTER — Ambulatory Visit: Payer: No Typology Code available for payment source | Attending: Internal Medicine | Admitting: Pharmacist

## 2022-01-18 VITALS — BP 128/72 | HR 85

## 2022-01-18 DIAGNOSIS — E782 Mixed hyperlipidemia: Secondary | ICD-10-CM | POA: Diagnosis not present

## 2022-01-18 DIAGNOSIS — E785 Hyperlipidemia, unspecified: Secondary | ICD-10-CM | POA: Insufficient documentation

## 2022-01-18 DIAGNOSIS — I10 Essential (primary) hypertension: Secondary | ICD-10-CM

## 2022-01-18 NOTE — Patient Instructions (Signed)
Your blood pressure is at goal < 130/21mHg  Continue taking your current medications  We'll recheck your cholesterol today since starting your rosuvastatin

## 2022-01-18 NOTE — Progress Notes (Signed)
Patient ID: Tammie Sanders                 DOB: Oct 02, 1965                      MRN: [id number]     HPI: Tammie Sanders is a 56 y.o. female referred by Dr. Ali Lowe to HTN clinic. PMH is significant for chest pain, HTN, asthma, and anxiety. First seen by cardiology on 12/01/21. BP was 135/95 and losartan was increased to '75mg'$  daily. She was also started on Imdur and prn NTG for chest pain, and coronary CTA and echo were ordered. Calcium score 0 on 12/16/21, echo with EF 60-65% on 12/19/21. She called in on 11/7 reporting daily headaches since starting Imdur. Went to the ER for this, head CT normal, BMET stable. Called clinic after, home BP readings were elevated at 172/105 and 182/114, also had some blurred vision associated with high BP readings. She was advised to stop her Imdur and increase her losartan to '100mg'$ , and was referred to PharmD for HTN management.  Pt presents today for follow up. Headaches have resolved since stopping Imdur. Denies dizziness and LE edema. Took some time to adjust to higher losartan dose, initially felt a bit lethargic but this has been improving. Had 1 episode of chest pain since her last visit, did not use NTG. Home BP 145/90 since increasing her losartan.   Reports her PCP started her on rosuvastatin in October after her lab work. Med was not on our list, this has been updated. She reports her cholesterol was unusually elevated at that time and historically has been well controlled. Lipids scanned in from [employer] on 11/28/21 were quite elevated - TC 426, TG 590, HDL 71, LDL 221, nonHDL 355. Doesn't wish to be on a statin if she doesn't need to be. Prior lipids were much better - 06/03/19 TC 221, TG 192, nonHDL 124, LDL 92. Will recheck today in case there was a previous lab error. LFTs were mildly elevated at that time, too. Of note, she's not fasting today and ate a cheeseburger on her way to clinic.  Current HTN meds: losartan '100mg'$  daily, Toprol '50mg'$  daily  BP goal:  <130/81mHg  Family History: Father with DM, heart disease. Mother with heart disease.  Social History: Former smoker 1 PPD for 5 years, quit in 1990. Social alcohol use.  Diet: chicken, salads. Minimal caffeine. Most meals at home.  Exercise: walks [place]. Less active lately due [place]  Home BP readings: 145/90  Labs: 11/28/21: TC 426, TG 590, HDL 71, LDL 221, nonHDL 355 06/03/19: TC 221, TG 192, nonHDL 124, LDL 92 Wt Readings from Last 3 Encounters:  01/02/22 145 lb 6.4 oz (66 kg)  12/01/21 144 lb 9.6 oz (65.6 kg)  04/26/21 145 lb 6.4 oz (66 kg)   BP Readings from Last 3 Encounters:  01/02/22 130/64  12/16/21 130/82  12/01/21 (!) 135/95   Pulse Readings from Last 3 Encounters:  01/02/22 72  12/16/21 70  12/01/21 68    Renal function: CrCl cannot be calculated (Patient's most recent lab result is older than the maximum 21 days allowed.).  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 8 g 5   budesonide-formoterol (SYMBICORT) 80-4.5 MCG/ACT inhaler Inhale 2 puffs  into the lungs 2 (two) times daily. 10.2 g 5   cetirizine (ZYRTEC) 10 MG tablet Take 10 mg by mouth daily.     Dupilumab (DUPIXENT) 300 MG/2ML SOPN INJECT '300MG'$  SUBCUTANEOUSLY  EVERY OTHER WEEK 12 mL 3   EPINEPHrine 0.3 mg/0.3 mL IJ SOAJ injection Inject 0.3 mLs (0.3 mg total) into the muscle once. (Patient not taking: Reported on 01/02/2022) 1 Device 1   FLUoxetine (PROZAC) 40 MG capsule Take 40 mg by mouth daily.     losartan (COZAAR) 100 MG tablet Take 1 tablet (100 mg total) by mouth daily. 30 tablet 11   Melatonin 3 MG CAPS Take by mouth.     metoprolol succinate (TOPROL-XL) 50 MG 24 hr tablet Take 50 mg by mouth daily.     nitroGLYCERIN (NITROSTAT) 0.4 MG SL tablet Place 1 tablet (0.4 mg total) under the tongue every 5 (five) minutes as needed for chest pain. 25 tablet 3   oxybutynin (DITROPAN-XL) 10 MG 24 hr tablet Take 10 mg by mouth daily.      No current facility-administered medications on file prior to visit.    Allergies  Allergen Reactions   Mold Extract [Trichophyton] Shortness Of Breath   Amoxicillin-Pot Clavulanate     Closed up throat, could not swallow.   Clindamycin/Lincomycin    Depakote [Divalproex Sodium] Other (See Comments)    Weight gain   Levaquin [Levofloxacin]    Other    Topamax [Topiramate] Other (See Comments)    Muscle pain   Tegretol [Carbamazepine] Rash     Assessment/Plan:  1. Hypertension - BP well controlled at goal < 130/15mHg. Will continue losartan '100mg'$  daily and Toprol '50mg'$  daily. Checking BMET today with recent dose increase of losartan. Discussed sodium and caffeine restriction, avoiding NSAIDs, and increasing physical activity. She'll keep an eye on her BP at home, reviewed appropriate technique.  2. Hyperlipidemia - PCP started pt on rosuvastatin '10mg'$  daily in October due to unusually high cholesterol which had historically been well controlled on no meds. Will recheck today, pt doesn't wish to stay on a statin if she doesn't need to.  Unsure if there was lab error on this draw as her cholesterol was incredibly higher on her October labs compared to prior baseline (TC 200 higher, TG 400 higher, LDL 190 higher than normal), LFTs were also mildly elevated at that check. Will add on direct LDL since pt not fasting today.  F/u with PharmD as needed.  Joyce Heitman E. Adom Schoeneck, PharmD, BCACP, [place] [address] [place] [address]Phone: [phone]  Fax: [phone] 01/18/2022 2:00 PM

## 2022-01-19 ENCOUNTER — Telehealth: Payer: Self-pay | Admitting: Pharmacist

## 2022-01-19 DIAGNOSIS — E782 Mixed hyperlipidemia: Secondary | ICD-10-CM

## 2022-01-19 LAB — COMPREHENSIVE METABOLIC PANEL
ALT: 33 IU/L — ABNORMAL HIGH (ref 0–32)
AST: 41 IU/L — ABNORMAL HIGH (ref 0–40)
Albumin/Globulin Ratio: 1.5 (ref 1.2–2.2)
Albumin: 4.3 g/dL (ref 3.8–4.9)
Alkaline Phosphatase: 67 IU/L (ref 44–121)
BUN/Creatinine Ratio: 19 (ref 9–23)
BUN: 14 mg/dL (ref 6–24)
Bilirubin Total: 0.4 mg/dL (ref 0.0–1.2)
CO2: 25 mmol/L (ref 20–29)
Calcium: 9.6 mg/dL (ref 8.7–10.2)
Chloride: 100 mmol/L (ref 96–106)
Creatinine, Ser: 0.73 mg/dL (ref 0.57–1.00)
Globulin, Total: 2.8 g/dL (ref 1.5–4.5)
Glucose: 95 mg/dL (ref 70–99)
Potassium: 3.8 mmol/L (ref 3.5–5.2)
Sodium: 142 mmol/L (ref 134–144)
Total Protein: 7.1 g/dL (ref 6.0–8.5)
eGFR: 96 mL/min/{1.73_m2} (ref >59)

## 2022-01-19 LAB — LIPID PANEL
Chol/HDL Ratio: 2.1 ratio (ref 0.0–4.4)
Cholesterol, Total: 194 mg/dL (ref 100–199)
HDL: 93 mg/dL (ref >39)
LDL Chol Calc (NIH): 70 mg/dL (ref 0–99)
Triglycerides: 192 mg/dL — ABNORMAL HIGH (ref 0–149)
VLDL Cholesterol Cal: 31 mg/dL (ref 5–40)

## 2022-01-19 LAB — LDL CHOLESTEROL, DIRECT: LDL Direct: 44 mg/dL (ref 0–99)

## 2022-01-19 NOTE — Telephone Encounter (Signed)
BMET stable on dose increase of losartan. LFTs with very slight elevation, essentially normal. Cholesterol well controlled. This was checked per pt after reports of very high cholesterol at her PCP in October that was out of the ordinary for her - TC 426, TG 590, HDL 71, LDL 221, nonHDL 355. She was then started on Crestor '10mg'$  daily.   Lipids yesterday show TC 194, TG 192 (not fasting), HDL 93, and direct LDL 44. Based on these, previously elevated labs look to be an error. Pt doesn't want to stay on Crestor if she doesn't need to. With CAC of 0 and direct LDL on statin of 44, I think it would be reasonable to discontinue her statin and recheck lipids in another few months to make sure they're still normal (would anticipate baseline LDL in the 70s-80s off of statin based on these results). Left message for pt to discuss.

## 2022-01-20 NOTE — Telephone Encounter (Signed)
Spoke with pt and reviewed the below. She will stop her Crestor, will recheck labs at next f/u appt in Feb.

## 2022-01-20 NOTE — Addendum Note (Signed)
Addended by: Delanie Tirrell E on: 01/20/2022 08:16 AM   Modules accepted: Orders

## 2022-03-13 ENCOUNTER — Ambulatory Visit: Payer: No Typology Code available for payment source | Admitting: Internal Medicine

## 2022-03-13 ENCOUNTER — Other Ambulatory Visit: Payer: No Typology Code available for payment source

## 2022-04-05 ENCOUNTER — Telehealth: Payer: Self-pay | Admitting: Pulmonary Disease

## 2022-04-05 ENCOUNTER — Other Ambulatory Visit (HOSPITAL_COMMUNITY): Payer: Self-pay

## 2022-04-05 NOTE — Telephone Encounter (Signed)
Just an FYI Please see previous encounter

## 2022-04-05 NOTE — Telephone Encounter (Signed)
Received notification from  [facility]  regarding a prior authorization for [place]. Authorization has been APPROVED from 03/06/2022 to 10/06/22. Approval letter sent to scan center.  Unable to run test claim but [facility] should still be in-network for patient after search online for eligible pharmacies.  Authorization # [id number]  ATC pt - phone went straight to VM. Left detailed VM regarding active approval and that she may call [employer] to schedule refill if due for it. Provided her my direct number if needed. Closing encounter as nothing further should be needed.  Knox Saliva, PharmD, MPH, BCPS, CPP Clinical Pharmacist (Rheumatology and Pulmonology)

## 2022-04-05 NOTE — Telephone Encounter (Signed)
Pt states [employer] will be faxing over a something to [place] for her Dupixent Inj

## 2022-04-05 NOTE — Telephone Encounter (Signed)
Patient appears to have change in insurance to [place] from [facility].  Submitted a Prior Authorization request to [employer]  for [place] via CoverMyMeds. Will update once we receive a response.  Key: [id number]  Knox Saliva, PharmD, MPH, BCPS, CPP Clinical Pharmacist (Rheumatology and Pulmonology)

## 2022-04-24 ENCOUNTER — Other Ambulatory Visit: Payer: Self-pay | Admitting: Pharmacist

## 2022-04-24 DIAGNOSIS — J4551 Severe persistent asthma with (acute) exacerbation: Secondary | ICD-10-CM

## 2022-04-24 MED ORDER — DUPIXENT 300 MG/2ML ~~LOC~~ SOAJ: SUBCUTANEOUS | 2 refills | Status: DC

## 2022-04-24 NOTE — Telephone Encounter (Signed)
Rx for Dupixent sent to [facility] today.  Knox Saliva, PharmD, MPH, BCPS, CPP Clinical Pharmacist (Rheumatology and Pulmonology)

## 2022-04-24 NOTE — Telephone Encounter (Signed)
Rx for Dupixent sent to [facility] today.  Knox Saliva, PharmD, MPH, BCPS, CPP Clinical Pharmacist (Rheumatology and Pulmonology)

## 2022-07-27 NOTE — Progress Notes (Signed)
Cardiology Office Note:    Date:  07/27/2022   ID:  Tammie Sanders, DOB 1965-10-27, MRN [id number]  PCP:  Sigmund Hazel, MD   [facility] Providers Cardiologist:  Alverda Skeans, MD Referring MD: Sigmund Hazel, MD   Chief Complaint/Reason for Referral: Cardiology follow-up  PATIENT DID NOT APPEAR FOR APPOINTMENT   ASSESSMENT:    1. Precordial pain   2. Primary hypertension     PLAN:    In order of problems listed above: 1.  Chest pain: Negative cardiac workup with reassuring coronary CTA and echocardiogram. 2.  Hypertension:          History of Present Illness:    FOCUSED PROBLEM LIST:   1.  Severe persistent asthma 2.  Hypertension 3.  Anxiety   November 2023: The patient is a 57 y.o. female with the indicated medical history here for chest pain.  The patient was seen at her PCPs office recently.  Apparently she had been having chest pain for 6 months.  She describes a constant chest tightness and feelings of indigestion.  She has endorsed some shortness of breath and fatigue with more strenuous activity.  She states she is been very stressed, tired, and not sleeping very well.   The patient tells me that she has developed exertional angina.  This happens when she exerts herself moderately.  She will develop a central chest pressure.  When she stops and rests pressure will go away however she has discomfort that lasts several hours if not days.  She denies any shortness of breath and related to asthma with these episodes.  She denies any bleeding or bruising, paroxysmal atrial dyspnea, orthopnea, or severe peripheral edema.  She required no hospitalizations or emergency room visits in the last few months.  She has had to curtail her activity quite a bit due to the symptoms.  Plan: Start Imdur, increase losartan to 75 mg, and obtain coronary CTA and echocardiogram.  Today: In the interim the patient had an coronary CTA which showed a calcium score of 0.  An echocardiogram  was also reassuring.       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     Allergies:    Mold extract [trichophyton], Amoxicillin-pot clavulanate, Clindamycin/lincomycin, Depakote [divalproex sodium], Imdur [isosorbide nitrate], Levaquin [levofloxacin], Other, Topamax [topiramate], and Tegretol [carbamazepine]   Social History:   Social History   Tobacco Use   Smoking status: Former    Packs/day: 1.00    Years:  5.00    Additional pack years: 0.00    Total pack years: 5.00    Types: Cigarettes    Quit date: 01/31/1988    Years since quitting: 34.5   Smokeless tobacco: Never  Vaping Use   Vaping Use: Never used  Substance Use Topics   Alcohol use: Yes    Alcohol/week: 1.0 - 2.0 standard drink of alcohol    Types: 1 - 2 Standard drinks or equivalent per week    Comment: social   Drug use: No     Family Hx: Family History  Problem Relation Age of Onset   Diabetes Father    Cancer Father        skin   Arthritis Father    Heart disease Father    Asthma Mother    Rheum arthritis Mother    Arthritis Mother    Heart disease Mother    Other  Mother        hypoglycemia   Irritable bowel syndrome Daughter    Cancer Maternal Grandfather        skin   Melanoma Maternal Grandfather    Colon cancer Neg Hx    Esophageal cancer Neg Hx    Rectal cancer Neg Hx    Stomach cancer Neg Hx      Review of Systems:   Please see the history of present illness.    All other systems reviewed and are negative.     EKGs/Labs/Other Test Reviewed:    EKG:    EKG Interpretation  Date/Time:    Ventricular Rate:    PR Interval:    QRS Duration:   QT Interval:    QTC Calculation:   R Axis:     Text Interpretation:           Prior CV studies:  Cardiac Studies & Procedures       ECHOCARDIOGRAM  ECHOCARDIOGRAM COMPLETE 12/19/2021  Narrative ECHOCARDIOGRAM REPORT    Patient Name:   Tammie Sanders Date of Exam: 12/19/2021 Medical Rec #:  [id number]     Height:       64.0 in Accession #:    [id number]    Weight:       145.0 lb Date of Birth:  1965/06/24     BSA:          1.706 m Patient Age:    56 years      BP:           135/95 mmHg Patient Gender: F             HR:           73 bpm. Exam Location:  [address]  Procedure: 2D Echo, Cardiac Doppler, Color Doppler and Intracardiac Opacification Agent  Indications:    R07.9* Chest pain, unspecified  History:        Patient has no prior history of Echocardiogram examinations. Risk Factors:Hypertension. Precordial pain.  Sonographer:    Cathie Beams RCS Referring Phys: [id number] Orbie Pyo  IMPRESSIONS   1. Left ventricular ejection fraction, by estimation, is 60 to 65%. The left ventricle has normal function. The left ventricle has no regional wall motion abnormalities. Left ventricular diastolic parameters are indeterminate. 2. Right ventricular systolic function is normal. The right ventricular size is normal. 3. The mitral valve is normal in structure. No evidence of mitral valve regurgitation. No evidence of mitral stenosis. 4. The aortic valve is normal  in structure. Aortic valve regurgitation is not visualized. No  aortic stenosis is present. 5. The inferior vena cava is normal in size with greater than 50% respiratory variability, suggesting right atrial pressure of 3 mmHg.  FINDINGS Left Ventricle: Left ventricular ejection fraction, by estimation, is 60 to 65%. The left ventricle has normal function. The left ventricle has no regional wall motion abnormalities. The left ventricular internal cavity size was normal in size. There is no left ventricular hypertrophy. Left ventricular diastolic parameters are indeterminate.  Right Ventricle: The right ventricular size is normal. No increase in right ventricular wall thickness. Right ventricular systolic function is normal.  Left Atrium: Left atrial size was normal in size.  Right Atrium: Right atrial size was normal in size.  Pericardium: There is no evidence of pericardial effusion. Presence of epicardial fat layer.  Mitral Valve: The mitral valve is normal in structure. No evidence of mitral valve regurgitation. No evidence of mitral valve stenosis.  Tricuspid Valve: The tricuspid valve is normal in structure. Tricuspid valve regurgitation is not demonstrated. No evidence of tricuspid stenosis.  Aortic Valve: The aortic valve is normal in structure. Aortic valve regurgitation is not visualized. No aortic stenosis is present.  Pulmonic Valve: The pulmonic valve was normal in structure. Pulmonic valve regurgitation is not visualized. No evidence of pulmonic stenosis.  Aorta: The aortic root is normal in size and structure.  Venous: The inferior vena cava is normal in size with greater than 50% respiratory variability, suggesting right atrial pressure of 3 mmHg.  IAS/Shunts: No atrial level shunt detected by color flow Doppler.   LEFT VENTRICLE PLAX 2D LVIDd:         3.90 cm   Diastology LVIDs:         2.30 cm   LV e' medial:    7.51 cm/s LV PW:         0.80 cm   LV E/e' medial:   9.4 LV IVS:        0.80 cm   LV e' lateral:   11.60 cm/s LVOT diam:     2.00 cm   LV E/e' lateral: 6.1 LV SV:         67 LV SV Index:   39 LVOT Area:     3.14 cm   RIGHT VENTRICLE RV Basal diam:  2.80 cm RV S prime:     16.00 cm/s TAPSE (M-mode): 2.6 cm  LEFT ATRIUM             Index        RIGHT ATRIUM          Index LA diam:        3.00 cm 1.76 cm/m   RA Area:     8.94 cm LA Vol (A2C):   31.5 ml 18.46 ml/m  RA Volume:   13.40 ml 7.85 ml/m LA Vol (A4C):   31.1 ml 18.22 ml/m LA Biplane Vol: 32.3 ml 18.93 ml/m AORTIC VALVE LVOT Vmax:   99.00 cm/s LVOT Vmean:  63.000 cm/s LVOT VTI:    0.212 m  AORTA Ao Root diam: 2.50 cm Ao Asc diam:  2.80 cm  MITRAL VALVE MV Area (PHT): 4.29 cm    SHUNTS MV Decel Time: 177 msec    Systemic VTI:  0.21 m MV E velocity: 70.70 cm/s  Systemic Diam: 2.00 cm MV A velocity: 62.40 cm/s MV E/A ratio:  1.13  Kardie Tobb DO Electronically signed by Thomasene Ripple DO Signature Date/Time: 12/19/2021/3:39:13 PM    Final     CT SCANS  CT CORONARY MORPH W/CTA COR W/SCORE 12/16/2021  Addendum 12/16/2021  1:34 PM ADDENDUM REPORT: 12/16/2021 13:32  EXAM: OVER-READ INTERPRETATION  CT CHEST  The following report is an over-read performed by radiologist Dr. Jacob Moores [facility] on 12/16/2021. This over-read does not include interpretation of cardiac or coronary anatomy or pathology. The coronary CTA interpretation by the cardiologist is attached.  COMPARISON:  None.  FINDINGS: Vascular: Normal heart size. No pericardial effusion. Normal caliber thoracic aorta with no atherosclerotic disease.  Mediastinum/Nodes: Esophagus is unremarkable. No pathologically enlarged lymph nodes seen in the chest.  Lungs/Pleura: Lungs are clear. No pleural effusion or pneumothorax.  Upper Abdomen: No acute abnormality.  Musculoskeletal: No chest wall mass or suspicious bone lesions identified.  IMPRESSION: No acute  extracardiac abnormality.   Electronically Signed By: Allegra Lai M.D. On: 12/16/2021 13:32  Narrative CLINICAL DATA:  Chest pain  EXAM: Cardiac/Coronary CTA  TECHNIQUE: A non-contrast, gated CT scan was obtained with axial slices of 3 mm through the heart for calcium scoring. Calcium scoring was performed using the Agatston method. A 120 kV prospective, gated, contrast cardiac scan was obtained. Gantry rotation speed was 250 msecs and collimation was 0.6 mm. Two sublingual nitroglycerin tablets (0.8 mg) were given. The 3D data set was reconstructed in 5% intervals of the 35-75% of the R-R cycle. Diastolic phases were analyzed on a dedicated workstation using MPR, MIP, and VRT modes. The patient received 95 cc of contrast.  FINDINGS: Image quality: Excellent.  Noise artifact is: Limited.  Coronary Arteries:  Normal coronary origin.  Right dominance.  Left main: The left main is a large caliber vessel with a normal take off from the left coronary cusp that bifurcates to form a left anterior descending artery and a left circumflex artery.There is no plaque or stenosis.  Left anterior descending artery: The LAD is patent without evidence of plaque or stenosis. The LAD gives off 1 patent diagonal branch.  Left circumflex artery: The LCX is non-dominant and patent with no evidence of plaque or stenosis. The LCX gives off 2 patent obtuse marginal branches.  Right coronary artery: The RCA is dominant with normal take off from the right coronary cusp. There is no evidence of plaque or stenosis. The RCA terminates as a PDA and right posterolateral branch without evidence of plaque or stenosis.  Right Atrium: Right atrial size is within normal limits.  Right Ventricle: The right ventricular cavity is within normal limits.  Left Atrium: Left atrial size is normal in size with no left atrial appendage filling defect.  Left Ventricle: The ventricular cavity size is  within normal limits. There are no stigmata of prior infarction. There is no abnormal filling defect.  Pulmonary arteries: Normal in size without proximal filling defect.  Pulmonary veins: Normal pulmonary venous drainage.  Pericardium: Normal thickness with no significant effusion or calcium present.  Cardiac valves: The aortic valve is trileaflet without significant calcification. The mitral valve is normal structure without significant calcification.  Aorta: Normal caliber with no significant disease.  Extra-cardiac findings: See attached radiology report for non-cardiac structures.  IMPRESSION: 1. Coronary calcium score of 0. This was 0 percentile for age-, sex, and race-matched controls.  2.  Normal coronary origin with right dominance.  3.  Normal coronary arteries.  4.  Consider non atherosclerotic causes of chest pain.  RECOMMENDATIONS: 1. CAD-RADS 0: No evidence of CAD (0%). Consider non-atherosclerotic causes of chest pain.  2. CAD-RADS 1: Minimal non-obstructive CAD (0-24%). Consider non-atherosclerotic causes  of chest pain. Consider preventive therapy and risk factor modification.  3. CAD-RADS 2: Mild non-obstructive CAD (25-49%). Consider non-atherosclerotic causes of chest pain. Consider preventive therapy and risk factor modification.  4. CAD-RADS 3: Moderate stenosis. Consider symptom-guided anti-ischemic pharmacotherapy as well as risk factor modification per guideline directed care. Additional analysis with CT FFR will be submitted.  5. CAD-RADS 4: Severe stenosis. (70-99% or > 50% left main). Cardiac catheterization or CT FFR is recommended. Consider symptom-guided anti-ischemic pharmacotherapy as well as risk factor modification per guideline directed care. Invasive coronary angiography recommended with revascularization per published guideline statements.  6. CAD-RADS 5: Total coronary occlusion (100%). Consider cardiac catheterization or  viability assessment. Consider symptom-guided anti-ischemic pharmacotherapy as well as risk factor modification per guideline directed care.  7. CAD-RADS N: Non-diagnostic study. Obstructive CAD can't be excluded. Alternative evaluation is recommended.  Armanda Magic, MD  Electronically Signed: By: Armanda Magic M.D. On: 12/16/2021 11:32          Other studies Reviewed: Review of the additional studies/records demonstrates: No imaging evidence of aortic atherosclerosis or coronary artery calcification available   Recent Labs: 12/06/2021: Hemoglobin 13.4; Platelets 234 01/18/2022: ALT 33; BUN 14; Creatinine, Ser 0.73; Potassium 3.8; Sodium 142   Recent Lipid Panel Lab Results  Component Value Date/Time   CHOL 194 01/18/2022 01:44 PM   TRIG 192 (H) 01/18/2022 01:44 PM   HDL 93 01/18/2022 01:44 PM   LDLCALC 70 01/18/2022 01:44 PM   LDLDIRECT 44 01/18/2022 01:44 PM    Risk Assessment/Calculations:     Physical Exam:      Signed, Orbie Pyo, MD  07/27/2022 7:39 AM    [facility] [address] [place], [place], [place]  [address] Phone: [phone]; Fax: [phone]   Note:  This document was prepared using Dragon voice recognition software and may include unintentional dictation errors.

## 2022-08-04 ENCOUNTER — Ambulatory Visit: Payer: BLUE CROSS/BLUE SHIELD | Attending: Internal Medicine

## 2022-08-04 ENCOUNTER — Ambulatory Visit: Payer: BLUE CROSS/BLUE SHIELD | Attending: Internal Medicine | Admitting: Internal Medicine

## 2022-08-04 DIAGNOSIS — I10 Essential (primary) hypertension: Secondary | ICD-10-CM

## 2022-08-04 DIAGNOSIS — R072 Precordial pain: Secondary | ICD-10-CM

## 2022-10-05 ENCOUNTER — Telehealth: Payer: Self-pay | Admitting: Pharmacist

## 2022-10-05 DIAGNOSIS — J4550 Severe persistent asthma, uncomplicated: Secondary | ICD-10-CM

## 2022-10-05 DIAGNOSIS — J8283 Eosinophilic asthma: Secondary | ICD-10-CM

## 2022-10-05 NOTE — Telephone Encounter (Signed)
Submitted a Prior Authorization renewal request to  [employer]  for [employer] via CoverMyMeds. Will update once we receive a response.  Key: [id number]

## 2022-10-11 NOTE — Telephone Encounter (Signed)
Received a fax regarding Prior Authorization from [facility]  for DUPIXENT. Authorization has been DENIED because patient must be using ICS + LABA, leukotriene receptor antagonist, LAMA, theophylline.  Called [facility] - pharmacist confirmed that Symbicort was last filled 03/07/2021. Patient states she is using Goodrx but pharmacy would still be able to see last fill date  Will need to work on appeal. If denied through insurance, patient may be eligible for bridge program  Referral to [facility] pulmonary clinic placed today since patient has relocated to [place], [place]  Chesley Mires, PharmD, MPH, BCPS, CPP Clinical Pharmacist (Rheumatology and Pulmonology)

## 2022-10-23 ENCOUNTER — Other Ambulatory Visit: Payer: Self-pay | Admitting: Pulmonary Disease

## 2022-10-27 NOTE — Telephone Encounter (Signed)
Submitted an URGENT appeal to  [employer]  for DUPIXENT.  Reference # [id number] Phone: [phone] Fax: [phone]  Chesley Mires, PharmD, MPH, BCPS, CPP Clinical Pharmacist (Rheumatology and Pulmonology)

## 2022-10-30 NOTE — Telephone Encounter (Signed)
Received fax from [employer]. Dupixent denial has been OVERTURNED. Dupixent is APPROVED from 09/28/22 through 09/28/22  Case # [id number]  Chesley Mires, PharmD, MPH, BCPS, CPP Clinical Pharmacist (Rheumatology and Pulmonology)

## 2023-01-05 ENCOUNTER — Other Ambulatory Visit: Payer: Self-pay | Admitting: Internal Medicine

## 2023-01-15 ENCOUNTER — Other Ambulatory Visit: Payer: Self-pay | Admitting: Pulmonary Disease

## 2023-01-15 DIAGNOSIS — J4551 Severe persistent asthma with (acute) exacerbation: Secondary | ICD-10-CM

## 2023-02-03 ENCOUNTER — Other Ambulatory Visit: Payer: Self-pay | Admitting: Internal Medicine

## 2023-02-06 ENCOUNTER — Other Ambulatory Visit: Payer: Self-pay

## 2023-02-06 MED ORDER — LOSARTAN POTASSIUM 100 MG PO TABS: 100.0000 mg | ORAL_TABLET | Freq: Every day | ORAL | 1 refills | Status: DC

## 2023-05-14 ENCOUNTER — Other Ambulatory Visit: Payer: Self-pay | Admitting: Internal Medicine

## 2023-11-11 ENCOUNTER — Other Ambulatory Visit: Payer: Self-pay | Admitting: Internal Medicine

## 2023-11-13 ENCOUNTER — Other Ambulatory Visit: Payer: Self-pay | Admitting: Internal Medicine

## 2023-12-08 ENCOUNTER — Other Ambulatory Visit: Payer: Self-pay | Admitting: Internal Medicine

## 2023-12-14 ENCOUNTER — Other Ambulatory Visit: Payer: Self-pay | Admitting: Internal Medicine
